# Patient Record
Sex: Female | Born: 1955 | Race: Black or African American | Hispanic: No | Marital: Single | Smoking: Former smoker
Health system: Southern US, Community
[De-identification: ages and names within clinical notes are randomized; demographics above are authoritative.]

## PROBLEM LIST (undated history)

## (undated) DIAGNOSIS — C801 Malignant (primary) neoplasm, unspecified: Secondary | ICD-10-CM

## (undated) DIAGNOSIS — I639 Cerebral infarction, unspecified: Secondary | ICD-10-CM

## (undated) DIAGNOSIS — I10 Essential (primary) hypertension: Secondary | ICD-10-CM

## (undated) DIAGNOSIS — Z8279 Family history of other congenital malformations, deformations and chromosomal abnormalities: Secondary | ICD-10-CM

## (undated) DIAGNOSIS — Q8500 Neurofibromatosis, unspecified: Secondary | ICD-10-CM

## (undated) DIAGNOSIS — Z8051 Family history of malignant neoplasm of kidney: Secondary | ICD-10-CM

## (undated) DIAGNOSIS — Z803 Family history of malignant neoplasm of breast: Secondary | ICD-10-CM

## (undated) HISTORY — DX: Neurofibromatosis, unspecified: Q85.00

## (undated) HISTORY — DX: Family history of other congenital malformations, deformations and chromosomal abnormalities: Z82.79

## (undated) HISTORY — PX: NO PAST SURGERIES: SHX2092

## (undated) HISTORY — DX: Family history of malignant neoplasm of breast: Z80.3

## (undated) HISTORY — DX: Family history of malignant neoplasm of kidney: Z80.51

---

## 2021-06-07 ENCOUNTER — Other Ambulatory Visit: Payer: Self-pay | Admitting: Nurse Practitioner

## 2021-06-07 DIAGNOSIS — I69354 Hemiplegia and hemiparesis following cerebral infarction affecting left non-dominant side: Secondary | ICD-10-CM

## 2021-06-07 DIAGNOSIS — R2689 Other abnormalities of gait and mobility: Secondary | ICD-10-CM

## 2021-06-09 ENCOUNTER — Encounter (HOSPITAL_COMMUNITY): Payer: Self-pay | Admitting: Emergency Medicine

## 2021-06-09 ENCOUNTER — Other Ambulatory Visit: Payer: Self-pay

## 2021-06-09 ENCOUNTER — Emergency Department (HOSPITAL_COMMUNITY): Payer: Medicare Other

## 2021-06-09 ENCOUNTER — Emergency Department (HOSPITAL_COMMUNITY)
Admission: EM | Admit: 2021-06-09 | Discharge: 2021-06-09 | Disposition: A | Discharge status: Home / Self Care | Payer: Medicare Other | Attending: Emergency Medicine | Admitting: Emergency Medicine

## 2021-06-09 DIAGNOSIS — Z8673 Personal history of transient ischemic attack (TIA), and cerebral infarction without residual deficits: Secondary | ICD-10-CM | POA: Diagnosis not present

## 2021-06-09 DIAGNOSIS — Z79899 Other long term (current) drug therapy: Secondary | ICD-10-CM | POA: Insufficient documentation

## 2021-06-09 DIAGNOSIS — I10 Essential (primary) hypertension: Secondary | ICD-10-CM | POA: Diagnosis not present

## 2021-06-09 DIAGNOSIS — Z7982 Long term (current) use of aspirin: Secondary | ICD-10-CM | POA: Diagnosis not present

## 2021-06-09 DIAGNOSIS — M542 Cervicalgia: Secondary | ICD-10-CM | POA: Diagnosis present

## 2021-06-09 DIAGNOSIS — C3490 Malignant neoplasm of unspecified part of unspecified bronchus or lung: Secondary | ICD-10-CM

## 2021-06-09 DIAGNOSIS — R531 Weakness: Secondary | ICD-10-CM | POA: Diagnosis not present

## 2021-06-09 DIAGNOSIS — R29898 Other symptoms and signs involving the musculoskeletal system: Secondary | ICD-10-CM

## 2021-06-09 HISTORY — DX: Essential (primary) hypertension: I10

## 2021-06-09 HISTORY — DX: Cerebral infarction, unspecified: I63.9

## 2021-06-09 LAB — CBC WITH DIFFERENTIAL/PLATELET
Abs Immature Granulocytes: 0.2 10*3/uL — ABNORMAL HIGH (ref 0.00–0.07)
Basophils Absolute: 0 10*3/uL (ref 0.0–0.1)
Basophils Relative: 0 %
Eosinophils Absolute: 0.1 10*3/uL (ref 0.0–0.5)
Eosinophils Relative: 1 %
HCT: 46.3 % — ABNORMAL HIGH (ref 36.0–46.0)
Hemoglobin: 14.4 g/dL (ref 12.0–15.0)
Immature Granulocytes: 1 %
Lymphocytes Relative: 8 %
Lymphs Abs: 1.5 10*3/uL (ref 0.7–4.0)
MCH: 29.7 pg (ref 26.0–34.0)
MCHC: 31.1 g/dL (ref 30.0–36.0)
MCV: 95.5 fL (ref 80.0–100.0)
Monocytes Absolute: 0.8 10*3/uL (ref 0.1–1.0)
Monocytes Relative: 4 %
Neutro Abs: 16.7 10*3/uL — ABNORMAL HIGH (ref 1.7–7.7)
Neutrophils Relative %: 86 %
Platelets: 511 10*3/uL — ABNORMAL HIGH (ref 150–400)
RBC: 4.85 MIL/uL (ref 3.87–5.11)
RDW: 15.5 % (ref 11.5–15.5)
WBC: 19.3 10*3/uL — ABNORMAL HIGH (ref 4.0–10.5)
nRBC: 0 % (ref 0.0–0.2)

## 2021-06-09 LAB — COMPREHENSIVE METABOLIC PANEL
ALT: 16 U/L (ref 0–44)
AST: 16 U/L (ref 15–41)
Albumin: 3.6 g/dL (ref 3.5–5.0)
Alkaline Phosphatase: 68 U/L (ref 38–126)
Anion gap: 11 (ref 5–15)
BUN: 35 mg/dL — ABNORMAL HIGH (ref 8–23)
CO2: 28 mmol/L (ref 22–32)
Calcium: 9.4 mg/dL (ref 8.9–10.3)
Chloride: 106 mmol/L (ref 98–111)
Creatinine, Ser: 0.61 mg/dL (ref 0.44–1.00)
GFR, Estimated: >60 mL/min (ref >60)
Glucose, Bld: 127 mg/dL — ABNORMAL HIGH (ref 70–99)
Potassium: 4.2 mmol/L (ref 3.5–5.1)
Sodium: 145 mmol/L (ref 135–145)
Total Bilirubin: 0.4 mg/dL (ref 0.3–1.2)
Total Protein: 7.4 g/dL (ref 6.5–8.1)

## 2021-06-09 MED ORDER — IOHEXOL 350 MG/ML SOLN
100.0000 mL | Freq: Once | INTRAVENOUS | Status: AC | PRN
  Administered 2021-06-09: 75 mL via INTRAVENOUS

## 2021-06-09 MED ORDER — PREDNISONE 20 MG PO TABS: 40.0000 mg | ORAL_TABLET | Freq: Every day | ORAL | 0 refills | Status: DC

## 2021-06-09 MED ORDER — OXYCODONE-ACETAMINOPHEN 5-325 MG PO TABS: 1.0000 | ORAL_TABLET | ORAL | 0 refills | Status: DC | PRN

## 2021-06-09 MED ORDER — SODIUM CHLORIDE (PF) 0.9 % IJ SOLN
INTRAMUSCULAR | Status: AC
  Filled 2021-06-09: qty 50

## 2021-06-09 NOTE — ED Provider Notes (Signed)
[facility] DEPT Provider Note   CSN: [id number] Arrival date & time: 06/09/21  2409     History Chief Complaint  Patient presents with   Neck Pain    Lynn Wiley is a 65 y.o. female.  65 year old female with past medical history including neurofibromatosis, hypertension, CVA with residual left-sided weakness who presents with right neck pain and right hand weakness.  Since May, she has had progressively worsening pain in her right neck going into her shoulder and down her arm.  Over the past week, she has had worsening weakness of her right hand.  She is able to lift and move her arm okay.  She has some mild left arm weakness due to her previous stroke but denies any other areas of new weakness and specifically no leg weakness.  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  She was referred here for further evaluation.  Of note, her documentation from the clinic visit notes that she just completed a prednisone taper.  Patient reports possible few pounds of weight loss but no dramatic weight loss.  She denies any abdominal pain, vomiting, diarrhea, bloody stools, headaches, vision changes, night sweats, chills, fevers, or urinary symptoms.  She denies any personal history of cancer.  The history is provided by the patient.  Neck Pain     Past Medical History:  Diagnosis Date   Hypertension    Stroke [facility])   Neurofibromatosis  There are no problems to display for this patient.   History reviewed. No pertinent surgical history.   OB History   No obstetric history on file.     No family history on file.     Home Medications Prior to Admission medications   Medication Sig Start Date End Date Taking? Authorizing Provider  amLODipine (NORVASC) 10 MG tablet Take 10 mg by mouth daily. 05/31/21  Yes [provider]  Ascorbic Acid (VITAMIN C PO)  Take 1 tablet by mouth daily.   Yes [provider]  ASPIRIN LOW DOSE 81 MG EC tablet Take 81 mg by mouth daily. 06/01/21  Yes [provider]  carvedilol (COREG) 12.5 MG tablet Take 12.5 mg by mouth 2 (two) times daily. 06/01/21  Yes [provider]  cyclobenzaprine (FLEXERIL) 5 MG tablet Take 5 mg by mouth 3 (three) times daily as needed for muscle spasms.   Yes [provider]  Menthol-Camphor (TIGER BALM ARTHRITIS RUB EX) Apply 1 application topically as needed (arm/hand pain).   Yes [provider]  Omega-3 Fatty Acids (FISH OIL PO) Take 1 capsule by mouth daily.   Yes [provider]  oxyCODONE-acetaminophen (PERCOCET) 5-325 MG tablet Take 1 tablet by mouth every 4 (four) hours as needed. 06/09/21  Yes Honest Safranek, Wenda Overland, MD  pravastatin (PRAVACHOL) 40 MG tablet Take 40 mg by mouth at bedtime. 06/01/21  Yes [provider]  predniSONE (DELTASONE) 20 MG tablet Take 2 tablets (40 mg total) by mouth daily. Take 40 mg by mouth daily for 3 days, then 20mg  by mouth daily for 3 days, then 10mg  daily for 3 days 06/09/21  Yes Edmund Holcomb, Wenda Overland, MD    Allergies    Patient has no known allergies.  Review of Systems   Review of Systems  Musculoskeletal:  Positive for neck pain.  All other systems reviewed and are negative except that which was mentioned in HPI  Physical Exam  Updated Vital Signs BP (!) 141/91   Pulse 85   Temp 97.6 F (36.4 C)   Resp 17   SpO2 99%   Physical Exam Constitutional:      General: She is not in acute distress.    Appearance: Normal appearance.  HENT:     Head: Normocephalic and atraumatic.  Eyes:     Conjunctiva/sclera: Conjunctivae normal.  Cardiovascular:     Rate and Rhythm: Normal rate and regular rhythm.     Heart sounds: Normal heart sounds. No murmur heard. Pulmonary:     Effort: Pulmonary effort is normal.     Breath sounds: Normal breath sounds.  Abdominal:     General: Abdomen  is flat. Bowel sounds are normal. There is no distension.     Palpations: Abdomen is soft.     Tenderness: There is no abdominal tenderness.  Musculoskeletal:        General: Normal range of motion.     Cervical back: Neck supple.     Right lower leg: No edema.     Left lower leg: No edema.  Skin:    General: Skin is warm and dry.     Comments: Diffuse skin tags throughout body including neck and trunk  Neurological:     Mental Status: She is alert and oriented to person, place, and time.     Deep Tendon Reflexes:     Reflex Scores:      Brachioradialis reflexes are 2+ on the right side and 3+ on the left side.      Patellar reflexes are 2+ on the right side and 2+ on the left side.    Comments: Fluent speech, CN II-XII intact, L pronator drift, 5/5 biceps/triceps strength BUE; 2/5 grip strength R hand; 5/5 strength BLE  Psychiatric:        Mood and Affect: Mood normal.        Behavior: Behavior normal.    ED Results / Procedures / Treatments   Labs (all labs ordered are listed, but only abnormal results are displayed) Labs Reviewed  COMPREHENSIVE METABOLIC PANEL - Abnormal; Notable for the following components:      Result Value   Glucose, Bld 127 (*)    BUN 35 (*)    All other components within normal limits  CBC WITH DIFFERENTIAL/PLATELET - Abnormal; Notable for the following components:   WBC 19.3 (*)    HCT 46.3 (*)    Platelets 511 (*)    Neutro Abs 16.7 (*)    Abs Immature Granulocytes 0.20 (*)    All other components within normal limits  PROTEIN ELECTROPHORESIS, SERUM  KAPPA/LAMBDA LIGHT CHAINS    EKG None  Radiology CT CHEST ABDOMEN PELVIS W CONTRAST  Result Date: 06/09/2021 CLINICAL DATA:  Lung lesion seen on thoracic spine films. Bone lesions noted in the cervical spine MRI from 06/07/2021. EXAM: CT CHEST, ABDOMEN, AND PELVIS WITH CONTRAST TECHNIQUE: Multidetector CT imaging of the chest, abdomen and pelvis was performed following the standard protocol  during bolus administration of intravenous contrast. CONTRAST:  62mL OMNIPAQUE IOHEXOL 350 MG/ML SOLN COMPARISON:  Thoracic and cervical spine films 06/02/2021 and cervical spine MRI 06/07/2021 FINDINGS: CT CHEST FINDINGS Cardiovascular: The heart is normal in size. No pericardial effusion. There is mild tortuosity, ectasia and calcification of the thoracic aorta but no dissection. Minimal scattered coronary artery calcifications. Mediastinum/Nodes: No mediastinal or hilar mass or lymphadenopathy. The esophagus is grossly normal. Lungs/Pleura: Biapical pleural and parenchymal scarring changes are noted. There  is a large lobulated mass in the left upper lobe measuring approximately 3.5 x 3.0 cm and worrisome primary lung neoplasm. There are 5 small right upper lobe pulmonary nodules all measuring less than 4 mm certainly suspicious for pulmonary metastatic nodules. Patchy areas of atelectasis and subpleural scarring changes at the left lung base. Musculoskeletal: Destructive lytic lesion involving the T1 vertebral body and transverse process and possibly part of the first right rib. This was also demonstrated on the recent MRI. I do not identify any other definite metastatic lesions involving the thoracic spine sternum or ribs. CT ABDOMEN PELVIS FINDINGS Hepatobiliary: Small low-attenuation lesion segment 8 is likely a benign cyst. No other hepatic lesions to suggest metastatic disease. Gallbladder is unremarkable. No common bile duct dilatation. Pancreas: No mass, inflammation or ductal dilatation. Mild fatty atrophy. Spleen: Normal size.  No focal lesions. Adrenals/Urinary Tract: 2.0 x 2.0 cm left adrenal gland lesion consistent with a metastatic focus. No right-sided renal gland lesion. Right renal cysts are noted. No worrisome renal lesions. The bladder is unremarkable. Stomach/Bowel: The stomach, duodenum, small and colon are unremarkable. No acute inflammatory changes, mass lesions obstructive findings.  Terminal ileum is normal. The appendix is normal. Vascular/Lymphatic: Age advanced atherosclerotic calcifications involving the aorta and iliac arteries but no aneurysm or dissection. Branch vessels are patent. The major venous structures are patent. No mesenteric or retroperitoneal mass or adenopathy. Reproductive: The uterus and ovaries are unremarkable. There is a rounded lesion in the left myometrium which is likely a benign fibroid. Other: No free abdominal/free pelvic fluid collections. There is a small periumbilical abdominal wall hernia containing fat. Musculoskeletal: No definite destructive bony changes involving the lumbar spine pelvis. IMPRESSION: 1. 3.5 x 3.0 cm lobulated left upper lobe mass worrisome for primary lung neoplasm. No mediastinal or hilar mass or adenopathy. Recommend PET-CT and referral to multidisciplinary thoracic Oncology clinic. 2. Small right upper lobe pulmonary nodules certainly suspicious for pulmonary metastatic nodules. 3. Destructive lytic lesion involving the right T1 vertebral body and transverse process and possibly part of the first right rib. Other thoracic spine metastatic disease noted on recent cervical spine MRI. No other definite metastatic lesions involving the thoracic or lumbar spine. 4. 2.0 x 2.0 cm left adrenal gland lesion consistent with metastatic disease. 5. No evidence of hepatic metastatic disease. 6. Age advanced atherosclerotic calcifications involving the thoracic and abdominal aorta and iliac arteries. Aortic Atherosclerosis (ICD10-I70.0). Electronically Signed   By: Marijo Sanes M.D.   On: 06/09/2021 13:47    Procedures Procedures   Medications Ordered in ED Medications  sodium chloride (PF) 0.9 % injection (  Given by Other 06/09/21 1306)  iohexol (OMNIPAQUE) 350 MG/ML injection 100 mL (75 mLs Intravenous Contrast Given 06/09/21 1233)    ED Course  I have reviewed the triage vital signs and the nursing notes.  Pertinent labs & imaging  results that were available during my care of the patient were reviewed by me and considered in my medical decision making (see chart for details).    MDM Rules/Calculators/A&P                           Right grip strength notable on exam and left pronator drift sounds chronic from previous stroke.  I reviewed notes and MRI from neurosurgical spine clinic.  Discussed with oncology, Dr. Alen Blew, who recommended MM labs and CT chest through pelvis.   CT shows left upper lobe mass suggestive of primary lung  neoplasm with metastatic nodules, lytic lesion at T1, and left adrenal lesion.  Patient has no other complaints of pain currently.  I discussed her neck symptoms with her neurosurgeon, Dr. Vertell Limber, who had seen her in the clinic this morning.  He recommended continuing Aspen collar and restarting prednisone taper.  Dr. Alen Blew has been messaged to assist w/ oncology clinic f/u for coordination of work up and treatment. Provided w/ prednisone and percocet for pain.  I have extensively reviewed return precautions with the patient and her daughter including any worsening neurologic symptoms or new neurologic symptoms.  They voiced understanding of oncology follow-up plan. Final Clinical Impression(s) / ED Diagnoses Final diagnoses:  Neck pain on right side  Primary malignant neoplasm of lung metastatic to other site, unspecified laterality ([place])  Right hand weakness    Rx / DC Orders ED Discharge Orders          Ordered    oxyCODONE-acetaminophen (PERCOCET) 5-325 MG tablet  Every 4 hours PRN        06/09/21 1537    predniSONE (DELTASONE) 20 MG tablet  Daily        06/09/21 1537             Emmaly Leech, Wenda Overland, MD 06/09/21 1615

## 2021-06-09 NOTE — Discharge Instructions (Addendum)
You can take colace 1 capsule once or twice daily. You can also take miralax 1 capful mixed in drink one to three times daily for constipation.   DO NOT TAKE TYLENOL IF YOU ARE TAKING PERCOCET FOR PAIN.

## 2021-06-09 NOTE — ED Triage Notes (Addendum)
Patient BIB daughter, sent by [place] [facility] after seen today with complaints of weakness in right hand x2 weeks and shoulder and neck pain. MRI completed on 7/18. Arrives with aspen collar applied at specialist PTA.

## 2021-06-10 ENCOUNTER — Encounter: Payer: Self-pay | Admitting: *Deleted

## 2021-06-10 DIAGNOSIS — C7951 Secondary malignant neoplasm of bone: Secondary | ICD-10-CM

## 2021-06-10 LAB — PROTEIN ELECTROPHORESIS, SERUM
A/G Ratio: 1 (ref 0.7–1.7)
Albumin ELP: 3.3 g/dL (ref 2.9–4.4)
Alpha-1-Globulin: 0.2 g/dL (ref 0.0–0.4)
Alpha-2-Globulin: 0.8 g/dL (ref 0.4–1.0)
Beta Globulin: 1.2 g/dL (ref 0.7–1.3)
Gamma Globulin: 1 g/dL (ref 0.4–1.8)
Globulin, Total: 3.2 g/dL (ref 2.2–3.9)
Total Protein ELP: 6.5 g/dL (ref 6.0–8.5)

## 2021-06-10 LAB — KAPPA/LAMBDA LIGHT CHAINS
Kappa free light chain: 21.4 mg/L — ABNORMAL HIGH (ref 3.3–19.4)
Kappa, lambda light chain ratio: 1.03 (ref 0.26–1.65)
Lambda free light chains: 20.8 mg/L (ref 5.7–26.3)

## 2021-06-10 NOTE — Progress Notes (Signed)
I received referral on Lynn Wiley today. I called and scheduled her to be seen next week with Dr. Julien Nordmann.  She verbalized understanding.

## 2021-06-11 ENCOUNTER — Ambulatory Visit
Admission: RE | Admit: 2021-06-11 | Discharge: 2021-06-11 | Disposition: A | Discharge status: Home / Self Care | Payer: Self-pay | Source: Ambulatory Visit | Attending: Radiation Oncology | Admitting: Radiation Oncology

## 2021-06-11 ENCOUNTER — Telehealth: Payer: Self-pay | Admitting: Radiation Oncology

## 2021-06-11 ENCOUNTER — Other Ambulatory Visit: Payer: Self-pay | Admitting: Radiation Oncology

## 2021-06-11 ENCOUNTER — Other Ambulatory Visit: Payer: Self-pay | Admitting: Radiation Therapy

## 2021-06-11 DIAGNOSIS — C7951 Secondary malignant neoplasm of bone: Secondary | ICD-10-CM

## 2021-06-14 NOTE — Progress Notes (Addendum)
Histology and Location of Primary Cancer:  Primary malignant neoplasm of lung metastatic to other site  Sites of Visceral and Bony Metastatic Disease: CT C/A/P w/ Contrast 06/09/2021 --IMPRESSION: 1. 3.5 x 3.0 cm lobulated left upper lobe mass worrisome for primary lung neoplasm. No mediastinal or hilar mass or adenopathy. Recommend PET-CT and referral to multidisciplinary thoracic Oncology clinic. 2. Small right upper lobe pulmonary nodules certainly suspicious for pulmonary metastatic nodules. 3. Destructive lytic lesion involving the right T1 vertebral body and transverse process and possibly part of the first right rib. Other thoracic spine metastatic disease noted on recent cervical spine MRI. No other definite metastatic lesions involving the thoracic or lumbar spine. 4. 2.0 x 2.0 cm left adrenal gland lesion consistent with metastatic disease. 5. No evidence of hepatic metastatic disease. 6. Age advanced atherosclerotic calcifications involving the thoracic and abdominal aorta and iliac arteries.  MRI Cervical Spine w/o Contrast 06/07/2021 --IMPRESSION: 1. Marrow replacing bone lesions within the C6, C7, and T1 vertebrae with involvement of the right pedicles and posterior elements. Associated bulky extraosseous soft tissue masses occupying the right-sided foramina at the C5-6, C6-7, C7 T1, and T1-T2 levels. Findings are most consistent with metastatic disease or possibly myeloma. 2. Epidural tumor involvement on the right extending from the C7-T1 to T1-T2 levels. There is mild mass effect on the thecal sac at the T1 level without significant canal stenosis or cord signal changes. 3. Right vertebral artery is completely encased by tumor at the C6 level. 4. Incompletely evaluated 2.2 cm somewhat nodular density in the periphery of the left upper lobe seen on localizer sequences. This finding could potentially represent primary malignancy. Contrast enhanced CT of the chest is recommended  for further evaluation.  Past/Anticipated chemotherapy by medical oncology, if any:  Scheduled for consultation with Dr. Curt Bears later today   Past/Anticipated interventions by cardiothoracic surgery, if any:  No referral placed at this time  Tobacco/Marijuana/Snuff/ETOH use: Former smoker (quit in 2017 after stroke).   Signs/Symptoms Weight changes, if any: Reports a decrease in appetite that has led to ~10 lb unintentional weight loss Respiratory complaints, if any: Reports a chronic dry cough that occurs randomly throughout the day Hemoptysis, if any: Patient denies Pain issues, if any:  Reports on-going pain to her right shoulder. Pain radiates down the medial aspect of her right arm to her 4th and 5th finger. Pain is constant and diminshes slightly with prescription pain medication (has to take pain medication around the clock for relief)  If Spine Met(s), symptoms, if any, include: Bowel/Bladder retention or incontinence (please describe): Reports occasional urinary leakage if she is unable to make it to the bathroom in time, as well as occasional stress incontinence. Daughter reports BM every 3 days (was an issue prior to prescription pain medication due to decrease in eating/drinking). Managing constipation with daily stool softners  Numbness or weakness in extremities (please describe): Reports numbness/tingling/burning sensation from her 4th and 5th fingers on her right hand, down to her right elbow. Unable to fully close her right hand to use for ADLs (patient is right hand dominate) Current Decadron regimen, if applicable: Not applicable; Patient is on a prednisone taper (has 2 days left of prescription)  Ambulatory status? Walker? Wheelchair?: Ambulatory but gait is unsteady. Often requires stand-by assist for balance issues  SAFETY ISSUES: Prior radiation? No Pacemaker/ICD? No Possible current pregnancy? No--postmenopausal Is the patient on methotrexate?  No  Current Complaints / other details:  Scheduled for Head CT w/o contrast  on 06/19/2021

## 2021-06-14 NOTE — Progress Notes (Signed)
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 Primary malignant neoplasm of lung metastatic to other site, unspecified laterality [facility])  C34.90 Ambulatory referral to Social Work    2. Bone metastases (HCC)  C79.51 dexamethasone (DECADRON) 4 MG tablet    omeprazole (PRILOSEC) 20 MG capsule     Stage IV putative metastatic lung cancer  CHIEF COMPLAINT: Here to discuss management of lung cancer with unspecified metastasis other site.  HISTORY OF PRESENT ILLNESS::Lynn Wiley is a 65 y.o. female who presented with right neck pain and right hand weakness to the [facility] ED on 06/09/21. The patient stated that since this past May, the right sided neck pain has been progressively worsening; with the pain going into her shoulder and down her arm. In the week prior to her ED visit, she stated that her right hand weakness became worse. She stated that she is able to lift and move her arm okay despite having mild left arm weakness due to history of CVA. MRI taken during this visit (she was seen at the [facility] clinic that day) revealed a likely metastatic lesion if the cervical spine. The patient denied any abdominal pain, vomiting, diarrhea, bloody stools, headaches, vision changes, night sweats, chills, fever, or urinary symptoms. (Patient was discharged the same day).   Furthermore, CT of the abdomen and pelvis with contrast taken on 06/09/21 demonstrated: a 3.5 x 3.0 cm lobulated left upper lobe mass concerning for primary lung neoplasm, as well as some small right upper lobe nodules suspicious for pulmonary metastatic nodules. Also  seen was a destructive lytic lesion involving the right T1 vertebral body and transverse process and possibly part of the first right rib, as well as a  2.0 x 2.0 cm left adrenal gland lesion; consistent with metastatic disease. Of note: other thoracic spine metastatic disease noted on recent cervical spine MRI taken during ED visit (results pending).   Of note: the patient reports a few pounds of weight loss .   Tobacco/Marijuana/Snuff/ETOH use: Former smoker (quit in 2017 after stroke).   Signs/Symptoms Weight changes, if any: Reports a decrease in appetite that has led to ~10 lb unintentional weight loss Respiratory complaints, if any: Reports a chronic dry cough that occurs randomly throughout the day Hemoptysis, if any: Patient denies Pain issues, if any:  Reports on-going pain to her right shoulder. Pain radiates down the medial aspect of her right arm to her 4th and 5th finger. Pain is constant and diminshes slightly with prescription pain medication (has to take pain medication around the clock for relief)  If Spine Met(s), symptoms, if any, include: Bowel/Bladder retention or incontinence (please describe): Reports occasional urinary leakage if she is unable to make it to the bathroom in time, as well as occasional stress incontinence. Daughter reports BM every 3 days (was an issue prior to prescription pain medication due to decrease in eating/drinking). Managing constipation with daily stool softners  Numbness or weakness in extremities (please describe): Reports numbness/tingling/burning sensation from her 4th and 5th fingers on her right hand, down to her right elbow. Unable to fully close her right hand to use for ADLs (patient  is right hand dominate) Current Decadron regimen, if applicable: Not applicable; Patient is on a prednisone taper (has 2 days left of prescription) - symptoms improved on $RemoveBef'40mg'UBLtfYzdnc$ /day and worsened upon tapering to lower dose  Ambulatory status? Walker? Wheelchair?:  Ambulatory but gait is unsteady. Often requires stand-by assist for balance issues  SAFETY ISSUES: Prior radiation? No Pacemaker/ICD? No Possible current pregnancy? No--postmenopausal Is the patient on methotrexate? No  Current Complaints / other details:  Scheduled for Head CT w/o contrast on 06/19/2021  She plans to move to [place] from [place] to be near family.  Tissue biopsy pending. Med/onc consult pending.   PREVIOUS RADIATION THERAPY: No  PAST MEDICAL HISTORY:  has a past medical history of Family history of breast cancer, Family history of kidney cancer, Family history of neurofibromatosis, Hypertension, Neurofibromatosis ([place]), and Stroke ([place]). And neurofibromatosis  PAST SURGICAL HISTORY: Past Surgical History:  Procedure Laterality Date   NO PAST SURGERIES      FAMILY HISTORY: family history includes Breast cancer in her paternal aunt and paternal aunt; Breast cancer (age of onset: 73) in her sister; Cancer in her cousin and paternal uncle; Gastric cancer in her brother; Heart attack in her brother; Kidney cancer in her brother; Lung cancer in her mother; Neurofibromatosis in her maternal aunt, maternal grandmother, maternal uncle, mother, and sister; Throat cancer in her father.  SOCIAL HISTORY:  reports that she quit smoking about 5 years ago. Her smoking use included cigarettes. She smoked an average of .5 packs per day. She has never used smokeless tobacco.  ALLERGIES: Patient has no known allergies.  MEDICATIONS: (Completed prednisone taper on day of ED visit ;06/09/21) Current Outpatient Medications  Medication Sig Dispense Refill   dexamethasone (DECADRON) 4 MG tablet Take 1 tablet (4 mg total) by mouth 3 (three) times daily. Take with food. 90 tablet 0   omeprazole (PRILOSEC) 20 MG capsule Take 1 capsule (20 mg total) by mouth daily. Take while on Dexamethasone to protect stomach. 30 capsule 2   amLODipine (NORVASC) 10 MG tablet Take 10 mg by mouth 2 (two) times  daily.     Ascorbic Acid (VITAMIN C PO) Take 1 tablet by mouth daily.     ASPIRIN LOW DOSE 81 MG EC tablet Take 81 mg by mouth daily.     carvedilol (COREG) 12.5 MG tablet Take 12.5 mg by mouth 2 (two) times daily.     cyclobenzaprine (FLEXERIL) 5 MG tablet Take 5 mg by mouth 3 (three) times daily as needed for muscle spasms.     LORazepam (ATIVAN) 0.5 MG tablet 1 tablet p.o. 30 minutes before the MRI.  Repeat once if needed 2 tablet 0   Menthol-Camphor (TIGER BALM ARTHRITIS RUB EX) Apply 1 application topically as needed (arm/hand pain).     Omega-3 Fatty Acids (FISH OIL PO) Take 1 capsule by mouth daily.     oxyCODONE-acetaminophen (PERCOCET) 5-325 MG tablet Take 1 tablet by mouth every 4 (four) hours as needed. 20 tablet 0   pravastatin (PRAVACHOL) 40 MG tablet Take 40 mg by mouth at bedtime.     No current facility-administered medications for this encounter.    REVIEW OF SYSTEMS:  Notable for that above.   PHYSICAL EXAM:  weight is 116 lb 6 oz (52.8 kg). Her blood pressure is 138/83 and her pulse is 84. Her respiration is 17 and oxygen saturation is 94%.   General: Alert and oriented, in no acute distress   HEENT: Head is normocephalic. Extraocular movements are  intact. Oropharynx is clear. Heart: Regular in rate and rhythm with no murmurs, rubs, or gallops. Chest: Clear to auscultation bilaterally, with no rhonchi, wheezes, or rales. Abdomen: Soft, nontender, nondistended, with no rigidity or guarding. Extremities: No cyanosis or edema. Skin: multiple neurofibromas Musculoskeletal: Right arm weakness, right hand weakness, decreased sensation along medial right arm and 4th, 5th digits Neurologic:  Speech is fluent. Coordination is intact. Psychiatric: Judgment and insight are intact. Affect is appropriate.   ECOG = 1  0 - Asymptomatic (Fully active, able to carry on all predisease activities without restriction)  1 - Symptomatic but completely ambulatory (Restricted in  physically strenuous activity but ambulatory and able to carry out work of a light or sedentary nature. For example, light housework, office work)  2 - Symptomatic, <50% in bed during the day (Ambulatory and capable of all self care but unable to carry out any work activities. Up and about more than 50% of waking hours)  3 - Symptomatic, >50% in bed, but not bedbound (Capable of only limited self-care, confined to bed or chair 50% or more of waking hours)  4 - Bedbound (Completely disabled. Cannot carry on any self-care. Totally confined to bed or chair)  5 - Death   Eustace Pen MM, Creech RH, Tormey DC, et al. [phone]). "Toxicity and response criteria of the [facility]". [place] Oncol. 5 (6): 649-55   LABORATORY DATA:  Lab Results  Component Value Date   WBC 21.1 (H) 06/15/2021   HGB 13.2 06/15/2021   HCT 41.6 06/15/2021   MCV 93.5 06/15/2021   PLT 510 (H) 06/15/2021   CMP     Component Value Date/Time   NA 143 06/15/2021 1044   K 3.6 06/15/2021 1044   CL 108 06/15/2021 1044   CO2 24 06/15/2021 1044   GLUCOSE 154 (H) 06/15/2021 1044   BUN 22 06/15/2021 1044   CREATININE 0.83 06/15/2021 1044   CALCIUM 8.8 (L) 06/15/2021 1044   PROT 6.7 06/15/2021 1044   ALBUMIN 2.9 (L) 06/15/2021 1044   AST 15 06/15/2021 1044   ALT 12 06/15/2021 1044   ALKPHOS 72 06/15/2021 1044   BILITOT 0.3 06/15/2021 1044   GFRNONAA >60 06/15/2021 1044         RADIOGRAPHY: CT CHEST ABDOMEN PELVIS W CONTRAST  Result Date: 06/09/2021 CLINICAL DATA:  Lung lesion seen on thoracic spine films. Bone lesions noted in the cervical spine MRI from 06/07/2021. EXAM: CT CHEST, ABDOMEN, AND PELVIS WITH CONTRAST TECHNIQUE: Multidetector CT imaging of the chest, abdomen and pelvis was performed following the standard protocol during bolus administration of intravenous contrast. CONTRAST:  32mL OMNIPAQUE IOHEXOL 350 MG/ML SOLN COMPARISON:  Thoracic and cervical spine films 06/02/2021 and cervical  spine MRI 06/07/2021 FINDINGS: CT CHEST FINDINGS Cardiovascular: The heart is normal in size. No pericardial effusion. There is mild tortuosity, ectasia and calcification of the thoracic aorta but no dissection. Minimal scattered coronary artery calcifications. Mediastinum/Nodes: No mediastinal or hilar mass or lymphadenopathy. The esophagus is grossly normal. Lungs/Pleura: Biapical pleural and parenchymal scarring changes are noted. There is a large lobulated mass in the left upper lobe measuring approximately 3.5 x 3.0 cm and worrisome primary lung neoplasm. There are 5 small right upper lobe pulmonary nodules all measuring less than 4 mm certainly suspicious for pulmonary metastatic nodules. Patchy areas of atelectasis and subpleural scarring changes at the left lung base. Musculoskeletal: Destructive lytic lesion involving the T1 vertebral body and transverse process and possibly part of the  first right rib. This was also demonstrated on the recent MRI. I do not identify any other definite metastatic lesions involving the thoracic spine sternum or ribs. CT ABDOMEN PELVIS FINDINGS Hepatobiliary: Small low-attenuation lesion segment 8 is likely a benign cyst. No other hepatic lesions to suggest metastatic disease. Gallbladder is unremarkable. No common bile duct dilatation. Pancreas: No mass, inflammation or ductal dilatation. Mild fatty atrophy. Spleen: Normal size.  No focal lesions. Adrenals/Urinary Tract: 2.0 x 2.0 cm left adrenal gland lesion consistent with a metastatic focus. No right-sided renal gland lesion. Right renal cysts are noted. No worrisome renal lesions. The bladder is unremarkable. Stomach/Bowel: The stomach, duodenum, small and colon are unremarkable. No acute inflammatory changes, mass lesions obstructive findings. Terminal ileum is normal. The appendix is normal. Vascular/Lymphatic: Age advanced atherosclerotic calcifications involving the aorta and iliac arteries but no aneurysm or  dissection. Branch vessels are patent. The major venous structures are patent. No mesenteric or retroperitoneal mass or adenopathy. Reproductive: The uterus and ovaries are unremarkable. There is a rounded lesion in the left myometrium which is likely a benign fibroid. Other: No free abdominal/free pelvic fluid collections. There is a small periumbilical abdominal wall hernia containing fat. Musculoskeletal: No definite destructive bony changes involving the lumbar spine pelvis. IMPRESSION: 1. 3.5 x 3.0 cm lobulated left upper lobe mass worrisome for primary lung neoplasm. No mediastinal or hilar mass or adenopathy. Recommend PET-CT and referral to multidisciplinary thoracic Oncology clinic. 2. Small right upper lobe pulmonary nodules certainly suspicious for pulmonary metastatic nodules. 3. Destructive lytic lesion involving the right T1 vertebral body and transverse process and possibly part of the first right rib. Other thoracic spine metastatic disease noted on recent cervical spine MRI. No other definite metastatic lesions involving the thoracic or lumbar spine. 4. 2.0 x 2.0 cm left adrenal gland lesion consistent with metastatic disease. 5. No evidence of hepatic metastatic disease. 6. Age advanced atherosclerotic calcifications involving the thoracic and abdominal aorta and iliac arteries. Aortic Atherosclerosis (ICD10-I70.0). Electronically Signed   By: Marijo Sanes M.D.   On: 06/09/2021 13:47      IMPRESSION/PLAN: Today, I talked to the patient about the findings and work-up thus far.  We discussed the patient's diagnosis of metastatic cancer to bone, likely from lung, and general treatment for this, highlighting the role of radiotherapy in the management.  We discussed the available radiation techniques, and focused on the details of logistics and delivery.     We discussed the risks, benefits, and side effects of  2 weeks of palliative spine radiotherapy. Side effects may include but not necessarily  be limited to: skin irritation, fatigue, mucositis, rare injury to internal organs/nerves/ and or spinal cord.  No guarantees of treatment were given. A consent form was signed and placed in the patient's medical record. The patient was encouraged to ask questions that I answered to the best of my ability.    I personally reviewed her images and am highly confident this is cancer, but diagnosis has not been established by tissue biopsy.  We discussed pros and cons of starting treatment ASAP vs starting tx after biopsy results are back.  Given the patient's and daughters' desire to prove diagnosis first and complete her move from [place] next week, we will arrange CT simulation for August 8 and start RT soon thereafter.  I told her to stop her Prednisone and Rx'd a high dose dexamethasone regimen to preserve function and help her pain/symptoms. Omeprazole for GI prophylaxis.  Referral to  genetics due to clinical dx of neurofibromatosis.  Pt, family, pleased with the plan above.   On date of service, in total, I spent 60 minutes on this encounter. Patient was seen in person.   __________________________________________   Eppie Gibson, MD  This document serves as a record of services personally performed by Eppie Gibson, MD. It was created on her behalf by Roney Mans, a trained medical scribe. The creation of this record is based on the scribe's personal observations and the provider's statements to them. This document has been checked and approved by the attending provider.

## 2021-06-15 ENCOUNTER — Encounter: Payer: Self-pay | Admitting: *Deleted

## 2021-06-15 ENCOUNTER — Telehealth: Payer: Self-pay | Admitting: Radiation Oncology

## 2021-06-15 ENCOUNTER — Inpatient Hospital Stay: Payer: Medicare Other

## 2021-06-15 ENCOUNTER — Encounter: Payer: Self-pay | Admitting: Internal Medicine

## 2021-06-15 ENCOUNTER — Other Ambulatory Visit: Payer: Self-pay

## 2021-06-15 ENCOUNTER — Ambulatory Visit
Admission: RE | Admit: 2021-06-15 | Discharge: 2021-06-15 | Disposition: A | Discharge status: Home / Self Care | Payer: Medicare Other | Source: Ambulatory Visit | Attending: Radiation Oncology | Admitting: Radiation Oncology

## 2021-06-15 ENCOUNTER — Inpatient Hospital Stay: Payer: Medicare Other | Attending: Internal Medicine | Admitting: Internal Medicine

## 2021-06-15 VITALS — BP 134/82 | HR 72 | Temp 97.3°F | Resp 19 | Wt 118.3 lb

## 2021-06-15 VITALS — BP 138/83 | HR 84 | Resp 17 | Wt 116.4 lb

## 2021-06-15 DIAGNOSIS — C801 Malignant (primary) neoplasm, unspecified: Secondary | ICD-10-CM | POA: Diagnosis not present

## 2021-06-15 DIAGNOSIS — Z87891 Personal history of nicotine dependence: Secondary | ICD-10-CM

## 2021-06-15 DIAGNOSIS — C3490 Malignant neoplasm of unspecified part of unspecified bronchus or lung: Secondary | ICD-10-CM

## 2021-06-15 DIAGNOSIS — C7951 Secondary malignant neoplasm of bone: Secondary | ICD-10-CM | POA: Insufficient documentation

## 2021-06-15 DIAGNOSIS — Z8052 Family history of malignant neoplasm of bladder: Secondary | ICD-10-CM | POA: Insufficient documentation

## 2021-06-15 DIAGNOSIS — R918 Other nonspecific abnormal finding of lung field: Secondary | ICD-10-CM | POA: Diagnosis present

## 2021-06-15 DIAGNOSIS — K439 Ventral hernia without obstruction or gangrene: Secondary | ICD-10-CM | POA: Diagnosis not present

## 2021-06-15 DIAGNOSIS — I10 Essential (primary) hypertension: Secondary | ICD-10-CM | POA: Diagnosis not present

## 2021-06-15 DIAGNOSIS — Z7952 Long term (current) use of systemic steroids: Secondary | ICD-10-CM | POA: Diagnosis not present

## 2021-06-15 DIAGNOSIS — Z8673 Personal history of transient ischemic attack (TIA), and cerebral infarction without residual deficits: Secondary | ICD-10-CM

## 2021-06-15 DIAGNOSIS — E279 Disorder of adrenal gland, unspecified: Secondary | ICD-10-CM | POA: Diagnosis not present

## 2021-06-15 DIAGNOSIS — R531 Weakness: Secondary | ICD-10-CM

## 2021-06-15 DIAGNOSIS — Z803 Family history of malignant neoplasm of breast: Secondary | ICD-10-CM | POA: Insufficient documentation

## 2021-06-15 DIAGNOSIS — Z8279 Family history of other congenital malformations, deformations and chromosomal abnormalities: Secondary | ICD-10-CM

## 2021-06-15 DIAGNOSIS — Z79899 Other long term (current) drug therapy: Secondary | ICD-10-CM | POA: Diagnosis not present

## 2021-06-15 DIAGNOSIS — Q8500 Neurofibromatosis, unspecified: Secondary | ICD-10-CM | POA: Diagnosis not present

## 2021-06-15 DIAGNOSIS — N281 Cyst of kidney, acquired: Secondary | ICD-10-CM | POA: Diagnosis not present

## 2021-06-15 DIAGNOSIS — R634 Abnormal weight loss: Secondary | ICD-10-CM | POA: Diagnosis not present

## 2021-06-15 LAB — CBC WITH DIFFERENTIAL (CANCER CENTER ONLY)
Abs Immature Granulocytes: 0.2 10*3/uL — ABNORMAL HIGH (ref 0.00–0.07)
Basophils Absolute: 0 10*3/uL (ref 0.0–0.1)
Basophils Relative: 0 %
Eosinophils Absolute: 0.1 10*3/uL (ref 0.0–0.5)
Eosinophils Relative: 0 %
HCT: 41.6 % (ref 36.0–46.0)
Hemoglobin: 13.2 g/dL (ref 12.0–15.0)
Immature Granulocytes: 1 %
Lymphocytes Relative: 5 %
Lymphs Abs: 1.1 10*3/uL (ref 0.7–4.0)
MCH: 29.7 pg (ref 26.0–34.0)
MCHC: 31.7 g/dL (ref 30.0–36.0)
MCV: 93.5 fL (ref 80.0–100.0)
Monocytes Absolute: 0.6 10*3/uL (ref 0.1–1.0)
Monocytes Relative: 3 %
Neutro Abs: 19.2 10*3/uL — ABNORMAL HIGH (ref 1.7–7.7)
Neutrophils Relative %: 91 %
Platelet Count: 510 10*3/uL — ABNORMAL HIGH (ref 150–400)
RBC: 4.45 MIL/uL (ref 3.87–5.11)
RDW: 15 % (ref 11.5–15.5)
WBC Count: 21.1 10*3/uL — ABNORMAL HIGH (ref 4.0–10.5)
nRBC: 0 % (ref 0.0–0.2)

## 2021-06-15 LAB — CMP (CANCER CENTER ONLY)
ALT: 12 U/L (ref 0–44)
AST: 15 U/L (ref 15–41)
Albumin: 2.9 g/dL — ABNORMAL LOW (ref 3.5–5.0)
Alkaline Phosphatase: 72 U/L (ref 38–126)
Anion gap: 11 (ref 5–15)
BUN: 22 mg/dL (ref 8–23)
CO2: 24 mmol/L (ref 22–32)
Calcium: 8.8 mg/dL — ABNORMAL LOW (ref 8.9–10.3)
Chloride: 108 mmol/L (ref 98–111)
Creatinine: 0.83 mg/dL (ref 0.44–1.00)
GFR, Estimated: >60 mL/min (ref >60)
Glucose, Bld: 154 mg/dL — ABNORMAL HIGH (ref 70–99)
Potassium: 3.6 mmol/L (ref 3.5–5.1)
Sodium: 143 mmol/L (ref 135–145)
Total Bilirubin: 0.3 mg/dL (ref 0.3–1.2)
Total Protein: 6.7 g/dL (ref 6.5–8.1)

## 2021-06-15 MED ORDER — OMEPRAZOLE 20 MG PO CPDR: 20.0000 mg | DELAYED_RELEASE_CAPSULE | Freq: Every day | ORAL | 2 refills | Status: DC

## 2021-06-15 MED ORDER — LORAZEPAM 0.5 MG PO TABS: ORAL_TABLET | ORAL | 0 refills | Status: DC

## 2021-06-15 MED ORDER — DEXAMETHASONE 4 MG PO TABS: 4.0000 mg | ORAL_TABLET | Freq: Three times a day (TID) | ORAL | 0 refills | Status: DC

## 2021-06-15 NOTE — Telephone Encounter (Signed)
Scheduled appt per 7/26 sch msg. Called pt, no answer. Left msg with appt date and time.

## 2021-06-15 NOTE — Progress Notes (Signed)
[facility] [phone]   [phone]  CONSULT NOTE  REFERRING PHYSICIAN: Dr. Ambrose Finland Little  REASON FOR CONSULTATION:  65 years old white female with highly suspicious lung cancer.  HPI Lynn Wiley is a 65 y.o. female with past medical history significant for hypertension, stroke and neurofibromatosis as well as dyslipidemia and long history of smoking but quit in 2017.  The patient presented to the emergency department on 06/09/2021 complaining of residual left-sided weakness as well as right neck pain and right hand weakness since May 2022 that has been progressively worse with more pain in the right neck area and to her shoulder and down her arm.  During her evaluation she underwent CT scan of the chest, abdomen pelvis that showed large lobulated mass in the left upper lobe measuring approximately 3.5 x 3.0 cm and worrisome for primary lung neoplasm.  There are 0.5 cm small right upper lobe pulmonary nodules, all measuring less than 0.4 cm suspicious for pulmonary metastatic nodules.  There was destructive lytic lesion involving the T1 vertebral body and transverse process and possibly part of the first right rib.  The scan also showed small low-attenuation lesion in the liver suspicious for benign cyst.  She also had a 2.0 x 2.0 cm left adrenal gland lesion consistent with metastatic disease. The patient was referred to me today for evaluation and recommendation regarding her condition. When seen today she is feeling fine with no concerning complaints except for mild cough and pain in the right arm and back around the right shoulder.  She has no hemoptysis.  The patient denied having any nausea, vomiting, diarrhea or constipation.  She denied having any headache or visual changes.  She has no weight loss or night sweats. Family history significant for father with throat cancer.  Mother had lung cancer.  Brother had kidney cancer and sister had breast  cancer. The patient is single and has 2 daughters she was accompanied today by her daughter, Lynn Wiley.  She is currently retired and used to work as a Lawyer in [employer].  She has a history of smoking less than 1 pack/day for around 43 years and quit in 2017.  She drinks alcohol occasionally and no history of drug abuse.   HPI  Past Medical History:  Diagnosis Date   Hypertension    Stroke [facility])     No past surgical history on file.  No family history on file.  Social History Social History   Tobacco Use   Smoking status: Former    Packs/day: 0.50    Types: Cigarettes    Quit date: 2017    Years since quitting: 5.5   Smokeless tobacco: Never    No Known Allergies  Current Outpatient Medications  Medication Sig Dispense Refill   amLODipine (NORVASC) 10 MG tablet Take 10 mg by mouth 2 (two) times daily.     Ascorbic Acid (VITAMIN C PO) Take 1 tablet by mouth daily.     ASPIRIN LOW DOSE 81 MG EC tablet Take 81 mg by mouth daily.     carvedilol (COREG) 12.5 MG tablet Take 12.5 mg by mouth 2 (two) times daily.     cyclobenzaprine (FLEXERIL) 5 MG tablet Take 5 mg by mouth 3 (three) times daily as needed for muscle spasms.     dexamethasone (DECADRON) 4 MG tablet Take 1 tablet (4 mg total) by mouth 3 (three) times daily. Take with food. 90 tablet 0   Menthol-Camphor (  TIGER BALM ARTHRITIS RUB EX) Apply 1 application topically as needed (arm/hand pain).     Omega-3 Fatty Acids (FISH OIL PO) Take 1 capsule by mouth daily.     omeprazole (PRILOSEC) 20 MG capsule Take 1 capsule (20 mg total) by mouth daily. Take while on Dexamethasone to protect stomach. 30 capsule 2   oxyCODONE-acetaminophen (PERCOCET) 5-325 MG tablet Take 1 tablet by mouth every 4 (four) hours as needed. 20 tablet 0   pravastatin (PRAVACHOL) 40 MG tablet Take 40 mg by mouth at bedtime.     No current facility-administered medications for this visit.    Review of Systems  Constitutional: positive for  fatigue Eyes: negative Ears, nose, mouth, throat, and face: negative Respiratory: positive for cough Cardiovascular: negative Gastrointestinal: negative Genitourinary:negative Integument/breast: negative Hematologic/lymphatic: negative Musculoskeletal:positive for back pain and neck pain Neurological: negative Behavioral/Psych: negative Endocrine: negative Allergic/Immunologic: negative  Physical Exam  OVA:NVBTY, healthy, no distress, well nourished, and well developed SKIN: skin color, texture, turgor are normal, no rashes or significant lesions HEAD: Normocephalic, No masses, lesions, tenderness or abnormalities EYES: normal, PERRLA, Conjunctiva are pink and non-injected EARS: External ears normal, Canals clear OROPHARYNX:no exudate, no erythema, and lips, buccal mucosa, and tongue normal  NECK: supple, no adenopathy, no JVD LYMPH:  no palpable lymphadenopathy, no hepatosplenomegaly BREAST:not examined LUNGS: clear to auscultation , and palpation HEART: regular rate & rhythm, no murmurs, and no gallops ABDOMEN:abdomen soft, non-tender, normal bowel sounds, and no masses or organomegaly BACK: Back symmetric, no curvature., No CVA tenderness EXTREMITIES:no joint deformities, effusion, or inflammation, no edema  NEURO: alert & oriented x 3 with fluent speech, no focal motor/sensory deficits Skin exam, significant neurofibromatosis  PERFORMANCE STATUS: ECOG 1  LABORATORY DATA: Lab Results  Component Value Date   WBC 21.1 (H) 06/15/2021   HGB 13.2 06/15/2021   HCT 41.6 06/15/2021   MCV 93.5 06/15/2021   PLT 510 (H) 06/15/2021      Chemistry      Component Value Date/Time   NA 143 06/15/2021 1044   K 3.6 06/15/2021 1044   CL 108 06/15/2021 1044   CO2 24 06/15/2021 1044   BUN 22 06/15/2021 1044   CREATININE 0.83 06/15/2021 1044      Component Value Date/Time   CALCIUM 8.8 (L) 06/15/2021 1044   ALKPHOS 72 06/15/2021 1044   AST 15 06/15/2021 1044   ALT 12  06/15/2021 1044   BILITOT 0.3 06/15/2021 1044       RADIOGRAPHIC STUDIES: CT CHEST ABDOMEN PELVIS W CONTRAST  Result Date: 06/09/2021 CLINICAL DATA:  Lung lesion seen on thoracic spine films. Bone lesions noted in the cervical spine MRI from 06/07/2021. EXAM: CT CHEST, ABDOMEN, AND PELVIS WITH CONTRAST TECHNIQUE: Multidetector CT imaging of the chest, abdomen and pelvis was performed following the standard protocol during bolus administration of intravenous contrast. CONTRAST:  74mL OMNIPAQUE IOHEXOL 350 MG/ML SOLN COMPARISON:  Thoracic and cervical spine films 06/02/2021 and cervical spine MRI 06/07/2021 FINDINGS: CT CHEST FINDINGS Cardiovascular: The heart is normal in size. No pericardial effusion. There is mild tortuosity, ectasia and calcification of the thoracic aorta but no dissection. Minimal scattered coronary artery calcifications. Mediastinum/Nodes: No mediastinal or hilar mass or lymphadenopathy. The esophagus is grossly normal. Lungs/Pleura: Biapical pleural and parenchymal scarring changes are noted. There is a large lobulated mass in the left upper lobe measuring approximately 3.5 x 3.0 cm and worrisome primary lung neoplasm. There are 5 small right upper lobe pulmonary nodules all measuring less than 4 mm  certainly suspicious for pulmonary metastatic nodules. Patchy areas of atelectasis and subpleural scarring changes at the left lung base. Musculoskeletal: Destructive lytic lesion involving the T1 vertebral body and transverse process and possibly part of the first right rib. This was also demonstrated on the recent MRI. I do not identify any other definite metastatic lesions involving the thoracic spine sternum or ribs. CT ABDOMEN PELVIS FINDINGS Hepatobiliary: Small low-attenuation lesion segment 8 is likely a benign cyst. No other hepatic lesions to suggest metastatic disease. Gallbladder is unremarkable. No common bile duct dilatation. Pancreas: No mass, inflammation or ductal  dilatation. Mild fatty atrophy. Spleen: Normal size.  No focal lesions. Adrenals/Urinary Tract: 2.0 x 2.0 cm left adrenal gland lesion consistent with a metastatic focus. No right-sided renal gland lesion. Right renal cysts are noted. No worrisome renal lesions. The bladder is unremarkable. Stomach/Bowel: The stomach, duodenum, small and colon are unremarkable. No acute inflammatory changes, mass lesions obstructive findings. Terminal ileum is normal. The appendix is normal. Vascular/Lymphatic: Age advanced atherosclerotic calcifications involving the aorta and iliac arteries but no aneurysm or dissection. Branch vessels are patent. The major venous structures are patent. No mesenteric or retroperitoneal mass or adenopathy. Reproductive: The uterus and ovaries are unremarkable. There is a rounded lesion in the left myometrium which is likely a benign fibroid. Other: No free abdominal/free pelvic fluid collections. There is a small periumbilical abdominal wall hernia containing fat. Musculoskeletal: No definite destructive bony changes involving the lumbar spine pelvis. IMPRESSION: 1. 3.5 x 3.0 cm lobulated left upper lobe mass worrisome for primary lung neoplasm. No mediastinal or hilar mass or adenopathy. Recommend PET-CT and referral to multidisciplinary thoracic Oncology clinic. 2. Small right upper lobe pulmonary nodules certainly suspicious for pulmonary metastatic nodules. 3. Destructive lytic lesion involving the right T1 vertebral body and transverse process and possibly part of the first right rib. Other thoracic spine metastatic disease noted on recent cervical spine MRI. No other definite metastatic lesions involving the thoracic or lumbar spine. 4. 2.0 x 2.0 cm left adrenal gland lesion consistent with metastatic disease. 5. No evidence of hepatic metastatic disease. 6. Age advanced atherosclerotic calcifications involving the thoracic and abdominal aorta and iliac arteries. Aortic Atherosclerosis  (ICD10-I70.0). Electronically Signed   By: Marijo Sanes M.D.   On: 06/09/2021 13:47    ASSESSMENT: This is a very pleasant 65 years old African-American female with highly suspicious stage IV non-small cell lung cancer presented with left upper lobe lung mass in addition to small pulmonary nodules as well as left adrenal gland lesion and T1 lytic lesion.   PLAN: I had a lengthy discussion with the patient and her daughter today about her current condition and further investigation to confirm her diagnosis. I personally and independently reviewed the scan images and discussed the result and showed the images to the patient and her daughter. I recommended for the patient to complete the staging work-up by ordering a PET scan as well as MRI of the brain to rule out any other metastatic disease. I will refer the patient to pulmonary medicine for consideration of bronchoscopy with endobronchial ultrasound and biopsy of the left upper lobe lung mass for confirmation of the tissue diagnosis. Have the tissue diagnosis is consistent with non-small cell lung cancer, adenocarcinoma, we will send the biopsy for molecular studies and PD-L1 expression. For the T1 lytic lesion, the patient had MRI of the cervical spine and she is currently followed by Dr. Isidore Moos for consideration of palliative radiotherapy to this area. I  will see the patient back for follow-up visit in around 2 weeks for evaluation and more detailed discussion of her treatment options based on the final staging work-up and molecular studies. The patient was advised to call immediately if she has any concerning symptoms in the interval.  The patient voices understanding of current disease status and treatment options and is in agreement with the current care plan.  All questions were answered. The patient knows to call the clinic with any problems, questions or concerns. We can certainly see the patient much sooner if necessary.  Thank you so  much for allowing me to participate in the care of [facility]. I will continue to follow up the patient with you and assist in her care.  The total time spent in the appointment was 60 minutes.  Disclaimer: This note was dictated with voice recognition software. Similar sounding words can inadvertently be transcribed and may not be corrected upon review.   Eilleen Kempf June 15, 2021, 12:38 PM

## 2021-06-15 NOTE — Progress Notes (Signed)
Oncology Nurse Navigator Documentation  Oncology Nurse Navigator Flowsheets 06/15/2021  Abnormal Finding Date 06/09/2021  Diagnosis Status Additional Work Up  Navigator Follow Up Date: 06/17/2021  Navigator Follow Up Reason: Appointment Review  Navigator Location [facility]  Navigator Encounter Type Clinic/MDC;Initial MedOnc  Patient Visit Type Initial;MedOnc  Treatment Phase Abnormal Scans  Barriers/Navigation Needs Education;Coordination of Care  Education Other  Interventions Psycho-Social Support;Coordination of Care;Education  Acuity Level 2-Minimal Needs (1-2 Barriers Identified)  Coordination of Care Other  Education Method Verbal  Time Spent with Patient 45

## 2021-06-16 ENCOUNTER — Encounter: Payer: Self-pay | Admitting: *Deleted

## 2021-06-16 ENCOUNTER — Encounter: Payer: Self-pay | Admitting: General Practice

## 2021-06-16 ENCOUNTER — Encounter: Payer: Self-pay | Admitting: Pulmonary Disease

## 2021-06-16 ENCOUNTER — Ambulatory Visit: Payer: Medicare Other | Admitting: Pulmonary Disease

## 2021-06-16 VITALS — BP 118/78 | HR 79 | Ht 64.0 in | Wt 116.6 lb

## 2021-06-16 DIAGNOSIS — C7951 Secondary malignant neoplasm of bone: Secondary | ICD-10-CM

## 2021-06-16 DIAGNOSIS — Z87891 Personal history of nicotine dependence: Secondary | ICD-10-CM | POA: Diagnosis not present

## 2021-06-16 DIAGNOSIS — R918 Other nonspecific abnormal finding of lung field: Secondary | ICD-10-CM

## 2021-06-16 NOTE — Patient Instructions (Addendum)
Thank you for visiting Dr. Valeta Harms at [facility]. Today we recommend the following: Orders Placed This Encounter  Procedures   Procedural/ Surgical Case Request: VIDEO BRONCHOSCOPY WITH ENDOBRONCHIAL NAVIGATION, VIDEO BRONCHOSCOPY WITH ENDOBRONCHIAL ULTRASOUND   CT Super D Chest Wo Contrast   Ambulatory referral to Pulmonology   Bronchoscopy date on 06/25/2021  Return in about 4 weeks (around 07/14/2021), or if symptoms worsen or fail to improve, for with APP or Dr. Valeta Harms.    Please do your part to reduce the spread of COVID-19.

## 2021-06-16 NOTE — H&P (View-Only) (Signed)
Synopsis: Referred in July 2022 for lung mass by Dr. Julien Nordmann and PCP: Marzetta Board, NP  Subjective:   PATIENT ID: Lynn Wiley GENDER: female DOB: 1956/06/15, MRN: [id number]  Chief Complaint  Patient presents with   Consult    Reports weight loss and pain in her shoulder that led to the abnormal lung imaging. She originally had imaging for her neck pain and saw something abnormal in her lung. She has a cough for years. Smoked 1/2 pack/day  for 42 years.     This is a 65 year old female, past medical history of hypertension, neurofibromatosis.  Patient has had a ongoing weight loss, shoulder pain, found to have weakness in her right arm.  She has smoked a half a pack a day for 42+ years.  Sent for CT scan imaging.  Found to have a large T1 thoracic lesion compressing nervous structure affecting her right arm.  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  Patient was referred here for discuss bronchoscopic evaluation for tissue diagnosis and biopsy.  Patient has already been seen in establish care with medical oncology Dr. Earlie Server.    Past Medical History:  Diagnosis Date   Hypertension    Neurofibromatosis ([place])    Stroke ([place])      Family History  Problem Relation Age of Onset   Neurofibromatosis Mother    Throat cancer Father    Neurofibromatosis Sister    Kidney cancer Brother    Gastric cancer Brother      Past Surgical History:  Procedure Laterality Date   NO PAST SURGERIES      Social History   Socioeconomic History   Marital status: Single    Spouse name: Not on file   Number of children: Not on file   Years of education: Not on file   Highest education level: Not on file  Occupational History   Not on file  Tobacco Use   Smoking status: Former    Packs/day: 0.50    Types: Cigarettes    Quit date: 2017    Years since quitting: 5.5   Smokeless tobacco: Never  Substance and Sexual  Activity   Alcohol use: Not on file   Drug use: Not on file   Sexual activity: Not on file  Other Topics Concern   Not on file  Social History Narrative   Not on file   Social Determinants of Health   Financial Resource Strain: Low Risk    Difficulty of Paying Living Expenses: Not hard at all  Food Insecurity: No Food Insecurity   Worried About Charity fundraiser in the Last Year: Never true   Ran Out of Food in the Last Year: Never true  Transportation Needs: No Transportation Needs   Lack of Transportation (Medical): No   Lack of Transportation (Non-Medical): No  Physical Activity: Not on file  Stress: Not on file  Social Connections: Socially Isolated   Frequency of Communication with Friends and Family: More than three times a week   Frequency of Social Gatherings with Friends and Family: More than three times a week   Attends Religious Services: Never   Marine scientist or Organizations: No   Attends Archivist Meetings: Never   Marital Status: Never married  Human resources officer Violence: Not on file     No Known Allergies   Outpatient Medications Prior to Visit  Medication Sig Dispense Refill   amLODipine ([place])  10 MG tablet Take 10 mg by mouth 2 (two) times daily.     Ascorbic Acid (VITAMIN C PO) Take 1 tablet by mouth daily.     ASPIRIN LOW DOSE 81 MG EC tablet Take 81 mg by mouth daily.     carvedilol (COREG) 12.5 MG tablet Take 12.5 mg by mouth 2 (two) times daily.     cyclobenzaprine (FLEXERIL) 5 MG tablet Take 5 mg by mouth 3 (three) times daily as needed for muscle spasms.     dexamethasone (DECADRON) 4 MG tablet Take 1 tablet (4 mg total) by mouth 3 (three) times daily. Take with food. 90 tablet 0   LORazepam (ATIVAN) 0.5 MG tablet 1 tablet p.o. 30 minutes before the MRI.  Repeat once if needed 2 tablet 0   Menthol-Camphor (TIGER BALM ARTHRITIS RUB EX) Apply 1 application topically as needed (arm/hand pain).     Omega-3 Fatty Acids (FISH OIL  PO) Take 1 capsule by mouth daily.     omeprazole (PRILOSEC) 20 MG capsule Take 1 capsule (20 mg total) by mouth daily. Take while on Dexamethasone to protect stomach. 30 capsule 2   oxyCODONE-acetaminophen (PERCOCET) 5-325 MG tablet Take 1 tablet by mouth every 4 (four) hours as needed. 20 tablet 0   pravastatin (PRAVACHOL) 40 MG tablet Take 40 mg by mouth at bedtime.     No facility-administered medications prior to visit.    Review of Systems  Constitutional:  Negative for chills, fever, malaise/fatigue and weight loss.  HENT:  Negative for hearing loss, sore throat and tinnitus.   Eyes:  Negative for blurred vision and double vision.  Respiratory:  Positive for cough, sputum production and shortness of breath. Negative for hemoptysis, wheezing and stridor.   Cardiovascular:  Negative for chest pain, palpitations, orthopnea, leg swelling and PND.  Gastrointestinal:  Negative for abdominal pain, constipation, diarrhea, heartburn, nausea and vomiting.  Genitourinary:  Negative for dysuria, hematuria and urgency.  Musculoskeletal:  Positive for back pain and neck pain. Negative for joint pain and myalgias.  Skin:  Negative for itching and rash.  Neurological:  Positive for focal weakness and weakness. Negative for dizziness, tingling and headaches.  Endo/Heme/Allergies:  Negative for environmental allergies. Does not bruise/bleed easily.  Psychiatric/Behavioral:  Negative for depression. The patient is not nervous/anxious and does not have insomnia.   All other systems reviewed and are negative.   Objective:  Physical Exam Vitals reviewed.  Constitutional:      General: She is not in acute distress.    Appearance: She is well-developed.  HENT:     Head: Normocephalic and atraumatic.     Mouth/Throat:     Pharynx: No oropharyngeal exudate.  Eyes:     Conjunctiva/sclera: Conjunctivae normal.     Pupils: Pupils are equal, round, and reactive to light.  Neck:     Vascular: No JVD.      Trachea: No tracheal deviation.     Comments: Loss of supraclavicular fat Cardiovascular:     Rate and Rhythm: Normal rate and regular rhythm.     Heart sounds: S1 normal and S2 normal.     Comments: Distant heart tones Pulmonary:     Effort: No tachypnea or accessory muscle usage.     Breath sounds: No stridor. Decreased breath sounds (throughout all lung fields) present. No wheezing, rhonchi or rales.  Abdominal:     General: Bowel sounds are normal. There is no distension.     Palpations: Abdomen is soft.  Tenderness: There is no abdominal tenderness.  Musculoskeletal:        General: Deformity (muscle wasting ) present.  Skin:    General: Skin is warm and dry.     Capillary Refill: Capillary refill takes less than 2 seconds.     Findings: No rash.  Neurological:     Mental Status: She is alert and oriented to person, place, and time.     Motor: Weakness present.     Gait: Gait abnormal.  Psychiatric:        Behavior: Behavior normal.     Vitals:   06/16/21 1402  BP: 118/78  Pulse: 79  SpO2: 99%  Weight: 116 lb 9.6 oz (52.9 kg)  Height: 5\' 4"  (1.626 m)   99% on  RA BMI Readings from Last 3 Encounters:  06/16/21 20.01 kg/m   Wt Readings from Last 3 Encounters:  06/16/21 116 lb 9.6 oz (52.9 kg)  06/15/21 116 lb 6 oz (52.8 kg)  06/15/21 118 lb 5 oz (53.7 kg)     CBC    Component Value Date/Time   WBC 21.1 (H) 06/15/2021 1044   WBC 19.3 (H) 06/09/2021 1023   RBC 4.45 06/15/2021 1044   HGB 13.2 06/15/2021 1044   HCT 41.6 06/15/2021 1044   PLT 510 (H) 06/15/2021 1044   MCV 93.5 06/15/2021 1044   MCH 29.7 06/15/2021 1044   MCHC 31.7 06/15/2021 1044   RDW 15.0 06/15/2021 1044   LYMPHSABS 1.1 06/15/2021 1044   MONOABS 0.6 06/15/2021 1044   EOSABS 0.1 06/15/2021 1044   BASOSABS 0.0 06/15/2021 1044     Chest Imaging: CT scan of the chest 06/09/2021: Patient found to have a 3.5 x 3 cm lobulated left upper lobe mass concerning for a primary  malignancy.  Also other small pulmonary nodules noted destructive lesion within the T1 vertebra and 2 cm left adrenal gland metastatic lesion. The patient's images have been independently reviewed by me.    Pulmonary Functions Testing Results: No flowsheet data found.  FeNO:   Pathology:   Echocardiogram:   Heart Catheterization:     Assessment & Plan:     ICD-10-CM   1. Lung mass  R91.8 Ambulatory referral to Pulmonology    Procedural/ Surgical Case Request: VIDEO BRONCHOSCOPY WITH ENDOBRONCHIAL NAVIGATION, VIDEO BRONCHOSCOPY WITH ENDOBRONCHIAL ULTRASOUND    CT Super D Chest Wo Contrast    2. Bone metastases ([place])  C79.51     3. Former smoker  Z87.891       Discussion:  This is a 65 year old female, former smoker found to have lung mass, bony metastasis, large T1 lesion affecting mobility and motor function of the right arm.  Overall clinical images concerning for an advanced age bronchogenic carcinoma.  She will need soft tissue biopsy for molecular studies due to her advanced stage malignancy.  Plan: We discussed bronchoscopy for tissue sampling. Patient is agreeable to proceed. We discussed the risk benefits and alternatives of bronchoscopy versus percutaneous needle biopsy. We will plan for bronchoscopy on 06/25/2021. Patient is agreeable to proceed at this time.    Current Outpatient Medications:    amLODipine (NORVASC) 10 MG tablet, Take 10 mg by mouth 2 (two) times daily., Disp: , Rfl:    Ascorbic Acid (VITAMIN C PO), Take 1 tablet by mouth daily., Disp: , Rfl:    ASPIRIN LOW DOSE 81 MG EC tablet, Take 81 mg by mouth daily., Disp: , Rfl:    carvedilol (COREG) 12.5 MG tablet, Take 12.5  mg by mouth 2 (two) times daily., Disp: , Rfl:    cyclobenzaprine (FLEXERIL) 5 MG tablet, Take 5 mg by mouth 3 (three) times daily as needed for muscle spasms., Disp: , Rfl:    dexamethasone (DECADRON) 4 MG tablet, Take 1 tablet (4 mg total) by mouth 3 (three) times daily. Take with  food., Disp: 90 tablet, Rfl: 0   LORazepam (ATIVAN) 0.5 MG tablet, 1 tablet p.o. 30 minutes before the MRI.  Repeat once if needed, Disp: 2 tablet, Rfl: 0   Menthol-Camphor (TIGER BALM ARTHRITIS RUB EX), Apply 1 application topically as needed (arm/hand pain)., Disp: , Rfl:    Omega-3 Fatty Acids (FISH OIL PO), Take 1 capsule by mouth daily., Disp: , Rfl:    omeprazole (PRILOSEC) 20 MG capsule, Take 1 capsule (20 mg total) by mouth daily. Take while on Dexamethasone to protect stomach., Disp: 30 capsule, Rfl: 2   oxyCODONE-acetaminophen (PERCOCET) 5-325 MG tablet, Take 1 tablet by mouth every 4 (four) hours as needed., Disp: 20 tablet, Rfl: 0   pravastatin (PRAVACHOL) 40 MG tablet, Take 40 mg by mouth at bedtime., Disp: , Rfl:     Garner Nash, DO [facility] 06/16/2021 7:43 PM

## 2021-06-16 NOTE — Progress Notes (Signed)
Synopsis: Referred in July 2022 for lung mass by Dr. Julien Nordmann and PCP: Marzetta Board, NP  Subjective:   PATIENT ID: Lynn Wiley GENDER: female DOB: 10-06-1956, MRN: [id number]  Chief Complaint  Patient presents with   Consult    Reports weight loss and pain in her shoulder that led to the abnormal lung imaging. She originally had imaging for her neck pain and saw something abnormal in her lung. She has a cough for years. Smoked 1/2 pack/day  for 42 years.     This is a 65 year old female, past medical history of hypertension, neurofibromatosis.  Patient has had a ongoing weight loss, shoulder pain, found to have weakness in her right arm.  She has smoked a half a pack a day for 42+ years.  Sent for CT scan imaging.  Found to have a large T1 thoracic lesion compressing nervous structure affecting her right arm.  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  Patient was referred here for discuss bronchoscopic evaluation for tissue diagnosis and biopsy.  Patient has already been seen in establish care with medical oncology Dr. Earlie Server.    Past Medical History:  Diagnosis Date   Hypertension    Neurofibromatosis ([place])    Stroke ([place])      Family History  Problem Relation Age of Onset   Neurofibromatosis Mother    Throat cancer Father    Neurofibromatosis Sister    Kidney cancer Brother    Gastric cancer Brother      Past Surgical History:  Procedure Laterality Date   NO PAST SURGERIES      Social History   Socioeconomic History   Marital status: Single    Spouse name: Not on file   Number of children: Not on file   Years of education: Not on file   Highest education level: Not on file  Occupational History   Not on file  Tobacco Use   Smoking status: Former    Packs/day: 0.50    Types: Cigarettes    Quit date: 2017    Years since quitting: 5.5   Smokeless tobacco: Never  Substance and Sexual  Activity   Alcohol use: Not on file   Drug use: Not on file   Sexual activity: Not on file  Other Topics Concern   Not on file  Social History Narrative   Not on file   Social Determinants of Health   Financial Resource Strain: Low Risk    Difficulty of Paying Living Expenses: Not hard at all  Food Insecurity: No Food Insecurity   Worried About Charity fundraiser in the Last Year: Never true   Ran Out of Food in the Last Year: Never true  Transportation Needs: No Transportation Needs   Lack of Transportation (Medical): No   Lack of Transportation (Non-Medical): No  Physical Activity: Not on file  Stress: Not on file  Social Connections: Socially Isolated   Frequency of Communication with Friends and Family: More than three times a week   Frequency of Social Gatherings with Friends and Family: More than three times a week   Attends Religious Services: Never   Marine scientist or Organizations: No   Attends Archivist Meetings: Never   Marital Status: Never married  Human resources officer Violence: Not on file     No Known Allergies   Outpatient Medications Prior to Visit  Medication Sig Dispense Refill   amLODipine ([place])  10 MG tablet Take 10 mg by mouth 2 (two) times daily.     Ascorbic Acid (VITAMIN C PO) Take 1 tablet by mouth daily.     ASPIRIN LOW DOSE 81 MG EC tablet Take 81 mg by mouth daily.     carvedilol (COREG) 12.5 MG tablet Take 12.5 mg by mouth 2 (two) times daily.     cyclobenzaprine (FLEXERIL) 5 MG tablet Take 5 mg by mouth 3 (three) times daily as needed for muscle spasms.     dexamethasone (DECADRON) 4 MG tablet Take 1 tablet (4 mg total) by mouth 3 (three) times daily. Take with food. 90 tablet 0   LORazepam (ATIVAN) 0.5 MG tablet 1 tablet p.o. 30 minutes before the MRI.  Repeat once if needed 2 tablet 0   Menthol-Camphor (TIGER BALM ARTHRITIS RUB EX) Apply 1 application topically as needed (arm/hand pain).     Omega-3 Fatty Acids (FISH OIL  PO) Take 1 capsule by mouth daily.     omeprazole (PRILOSEC) 20 MG capsule Take 1 capsule (20 mg total) by mouth daily. Take while on Dexamethasone to protect stomach. 30 capsule 2   oxyCODONE-acetaminophen (PERCOCET) 5-325 MG tablet Take 1 tablet by mouth every 4 (four) hours as needed. 20 tablet 0   pravastatin (PRAVACHOL) 40 MG tablet Take 40 mg by mouth at bedtime.     No facility-administered medications prior to visit.    Review of Systems  Constitutional:  Negative for chills, fever, malaise/fatigue and weight loss.  HENT:  Negative for hearing loss, sore throat and tinnitus.   Eyes:  Negative for blurred vision and double vision.  Respiratory:  Positive for cough, sputum production and shortness of breath. Negative for hemoptysis, wheezing and stridor.   Cardiovascular:  Negative for chest pain, palpitations, orthopnea, leg swelling and PND.  Gastrointestinal:  Negative for abdominal pain, constipation, diarrhea, heartburn, nausea and vomiting.  Genitourinary:  Negative for dysuria, hematuria and urgency.  Musculoskeletal:  Positive for back pain and neck pain. Negative for joint pain and myalgias.  Skin:  Negative for itching and rash.  Neurological:  Positive for focal weakness and weakness. Negative for dizziness, tingling and headaches.  Endo/Heme/Allergies:  Negative for environmental allergies. Does not bruise/bleed easily.  Psychiatric/Behavioral:  Negative for depression. The patient is not nervous/anxious and does not have insomnia.   All other systems reviewed and are negative.   Objective:  Physical Exam Vitals reviewed.  Constitutional:      General: She is not in acute distress.    Appearance: She is well-developed.  HENT:     Head: Normocephalic and atraumatic.     Mouth/Throat:     Pharynx: No oropharyngeal exudate.  Eyes:     Conjunctiva/sclera: Conjunctivae normal.     Pupils: Pupils are equal, round, and reactive to light.  Neck:     Vascular: No JVD.      Trachea: No tracheal deviation.     Comments: Loss of supraclavicular fat Cardiovascular:     Rate and Rhythm: Normal rate and regular rhythm.     Heart sounds: S1 normal and S2 normal.     Comments: Distant heart tones Pulmonary:     Effort: No tachypnea or accessory muscle usage.     Breath sounds: No stridor. Decreased breath sounds (throughout all lung fields) present. No wheezing, rhonchi or rales.  Abdominal:     General: Bowel sounds are normal. There is no distension.     Palpations: Abdomen is soft.  Tenderness: There is no abdominal tenderness.  Musculoskeletal:        General: Deformity (muscle wasting ) present.  Skin:    General: Skin is warm and dry.     Capillary Refill: Capillary refill takes less than 2 seconds.     Findings: No rash.  Neurological:     Mental Status: She is alert and oriented to person, place, and time.     Motor: Weakness present.     Gait: Gait abnormal.  Psychiatric:        Behavior: Behavior normal.     Vitals:   06/16/21 1402  BP: 118/78  Pulse: 79  SpO2: 99%  Weight: 116 lb 9.6 oz (52.9 kg)  Height: 5\' 4"  (1.626 m)   99% on  RA BMI Readings from Last 3 Encounters:  06/16/21 20.01 kg/m   Wt Readings from Last 3 Encounters:  06/16/21 116 lb 9.6 oz (52.9 kg)  06/15/21 116 lb 6 oz (52.8 kg)  06/15/21 118 lb 5 oz (53.7 kg)     CBC    Component Value Date/Time   WBC 21.1 (H) 06/15/2021 1044   WBC 19.3 (H) 06/09/2021 1023   RBC 4.45 06/15/2021 1044   HGB 13.2 06/15/2021 1044   HCT 41.6 06/15/2021 1044   PLT 510 (H) 06/15/2021 1044   MCV 93.5 06/15/2021 1044   MCH 29.7 06/15/2021 1044   MCHC 31.7 06/15/2021 1044   RDW 15.0 06/15/2021 1044   LYMPHSABS 1.1 06/15/2021 1044   MONOABS 0.6 06/15/2021 1044   EOSABS 0.1 06/15/2021 1044   BASOSABS 0.0 06/15/2021 1044     Chest Imaging: CT scan of the chest 06/09/2021: Patient found to have a 3.5 x 3 cm lobulated left upper lobe mass concerning for a primary  malignancy.  Also other small pulmonary nodules noted destructive lesion within the T1 vertebra and 2 cm left adrenal gland metastatic lesion. The patient's images have been independently reviewed by me.    Pulmonary Functions Testing Results: No flowsheet data found.  FeNO:   Pathology:   Echocardiogram:   Heart Catheterization:     Assessment & Plan:     ICD-10-CM   1. Lung mass  R91.8 Ambulatory referral to Pulmonology    Procedural/ Surgical Case Request: VIDEO BRONCHOSCOPY WITH ENDOBRONCHIAL NAVIGATION, VIDEO BRONCHOSCOPY WITH ENDOBRONCHIAL ULTRASOUND    CT Super D Chest Wo Contrast    2. Bone metastases ([place])  C79.51     3. Former smoker  Z87.891       Discussion:  This is a 65 year old female, former smoker found to have lung mass, bony metastasis, large T1 lesion affecting mobility and motor function of the right arm.  Overall clinical images concerning for an advanced age bronchogenic carcinoma.  She will need soft tissue biopsy for molecular studies due to her advanced stage malignancy.  Plan: We discussed bronchoscopy for tissue sampling. Patient is agreeable to proceed. We discussed the risk benefits and alternatives of bronchoscopy versus percutaneous needle biopsy. We will plan for bronchoscopy on 06/25/2021. Patient is agreeable to proceed at this time.    Current Outpatient Medications:    amLODipine ([place]) 10 MG tablet, Take 10 mg by mouth 2 (two) times daily., Disp: , Rfl:    Ascorbic Acid (VITAMIN C PO), Take 1 tablet by mouth daily., Disp: , Rfl:    ASPIRIN LOW DOSE 81 MG EC tablet, Take 81 mg by mouth daily., Disp: , Rfl:    carvedilol (COREG) 12.5 MG tablet, Take 12.5  mg by mouth 2 (two) times daily., Disp: , Rfl:    cyclobenzaprine (FLEXERIL) 5 MG tablet, Take 5 mg by mouth 3 (three) times daily as needed for muscle spasms., Disp: , Rfl:    dexamethasone (DECADRON) 4 MG tablet, Take 1 tablet (4 mg total) by mouth 3 (three) times daily. Take with  food., Disp: 90 tablet, Rfl: 0   LORazepam (ATIVAN) 0.5 MG tablet, 1 tablet p.o. 30 minutes before the MRI.  Repeat once if needed, Disp: 2 tablet, Rfl: 0   Menthol-Camphor (TIGER BALM ARTHRITIS RUB EX), Apply 1 application topically as needed (arm/hand pain)., Disp: , Rfl:    Omega-3 Fatty Acids (FISH OIL PO), Take 1 capsule by mouth daily., Disp: , Rfl:    omeprazole (PRILOSEC) 20 MG capsule, Take 1 capsule (20 mg total) by mouth daily. Take while on Dexamethasone to protect stomach., Disp: 30 capsule, Rfl: 2   oxyCODONE-acetaminophen (PERCOCET) 5-325 MG tablet, Take 1 tablet by mouth every 4 (four) hours as needed., Disp: 20 tablet, Rfl: 0   pravastatin (PRAVACHOL) 40 MG tablet, Take 40 mg by mouth at bedtime., Disp: , Rfl:     Garner Nash, DO [facility] 06/16/2021 7:43 PM

## 2021-06-16 NOTE — Progress Notes (Signed)
Lynn Wiley Psychosocial Distress Screening Clinical Social Work  Clinical Social Work was referred by distress screening protocol.  The patient scored a 5 on the Psychosocial Distress Thermometer which indicates moderate distress. Clinical Social Worker contacted patient by phone to assess for distress and other psychosocial needs. Patient asked that CSW speak w daughter Lynn Wiley.  Issue began w shoulder pain, which has now been diagnosed as lung cancer.  Patient has difficulty "getting around", cannot bathe herself, daughter has quit her job as no home health is available.  She does not qualify for Medicaid.  Plan is to reunite w long term fiance - once that happens daughter can return to work.  Family is providing all the care necessary for patient - transportation, physical help at home, food.  Briefly described role of Support Center and how to access services.  Affirmed daughter's commitment to mother and appreciation for her service to patient. They will call us if/when needed.    ONCBCN DISTRESS SCREENING 06/15/2021  Screening Type Initial Screening  Distress experienced in past week (1-10) 5  Physical Problem type Pain;Getting around;Tingling hands/feet  Physician notified of physical symptoms Yes  Referral to clinical psychology No  Referral to clinical social work Yes  Referral to dietition Yes  Referral to financial advocate No  Referral to support programs Yes  Referral to palliative care No    Clinical Social Worker follow up needed: No.  If yes, follow up plan:  Beverely Pace, [place], LCSW Clinical Social Worker Phone:  [phone]

## 2021-06-16 NOTE — Progress Notes (Signed)
I followed up on Mr. Portal's plan of care.  She is set up for bronchoscopy and her mri brain has been authorized.  I followed up with the authorization team to check on PET authorization.  Wait for an update.

## 2021-06-17 ENCOUNTER — Other Ambulatory Visit: Payer: Self-pay

## 2021-06-17 ENCOUNTER — Inpatient Hospital Stay: Payer: Medicare Other

## 2021-06-17 ENCOUNTER — Inpatient Hospital Stay (HOSPITAL_BASED_OUTPATIENT_CLINIC_OR_DEPARTMENT_OTHER): Payer: Medicare Other | Admitting: Genetic Counselor

## 2021-06-17 ENCOUNTER — Encounter: Payer: Self-pay | Admitting: Genetic Counselor

## 2021-06-17 ENCOUNTER — Telehealth: Payer: Self-pay | Admitting: Pulmonary Disease

## 2021-06-17 DIAGNOSIS — R918 Other nonspecific abnormal finding of lung field: Secondary | ICD-10-CM | POA: Diagnosis not present

## 2021-06-17 DIAGNOSIS — Q8500 Neurofibromatosis, unspecified: Secondary | ICD-10-CM | POA: Diagnosis not present

## 2021-06-17 DIAGNOSIS — Z8051 Family history of malignant neoplasm of kidney: Secondary | ICD-10-CM | POA: Insufficient documentation

## 2021-06-17 DIAGNOSIS — Z8279 Family history of other congenital malformations, deformations and chromosomal abnormalities: Secondary | ICD-10-CM

## 2021-06-17 DIAGNOSIS — Z803 Family history of malignant neoplasm of breast: Secondary | ICD-10-CM | POA: Insufficient documentation

## 2021-06-17 LAB — GENETIC SCREENING ORDER

## 2021-06-17 NOTE — Telephone Encounter (Signed)
ENB has been scheduled for 8/5 at 7:30.  CT scheduled for 8/3 at 9:30 at [facility].  Pt will need to go for covid test on 8/2.  I have called pt & left her vm to call me back for appt info.

## 2021-06-17 NOTE — Telephone Encounter (Signed)
Spoke to pt's dtr Adreka and gave her appt info.

## 2021-06-17 NOTE — Progress Notes (Signed)
REFERRING PROVIDER: Garner Nash, DO [address] [place],  [place] [address]  PRIMARY PROVIDER:  Marzetta Board, NP  PRIMARY REASON FOR VISIT:  1. Mass of upper lobe of left lung   2. Neurofibromatosis ([place])   3. Family history of neurofibromatosis   4. Family history of kidney cancer   5. Family history of breast cancer      HISTORY OF PRESENT ILLNESS:   [name], a 65 y.o. female, was seen for a [place] cancer genetics consultation at the request of Dr. Valeta Harms due to a personal and family history of cancer, and a personal family history of Neurofibromatosis, type 1.  Lynn Wiley presents to clinic today to discuss the possibility of a hereditary predisposition to cancer, genetic testing, and to further clarify her future cancer risks, as well as potential cancer risks for family members.   In July 202, at the age of 36, [name] was diagnosed with a lung mass that is concerning for cancer. She has a diagnosis of NF1, and was followed for this in [place], [place], but has not been seen for this condition since being back in [place].      CANCER HISTORY:  Oncology History   No history exists.     RISK FACTORS:  Menarche was at age 39.  First live birth at age 76.  OCP use for approximately 6 years.  Ovaries intact: yes.  Hysterectomy: no.  Menopausal status: postmenopausal.  HRT use: 0 years. Colonoscopy: yes; normal. Mammogram within the last year: no. Number of breast biopsies: 0. Up to date with pelvic exams: no. Any excessive radiation exposure in the past: no  Past Medical History:  Diagnosis Date   Family history of breast cancer    Family history of kidney cancer    Family history of neurofibromatosis    Hypertension    Neurofibromatosis ([place])    Stroke [facility])     Past Surgical History:  Procedure Laterality Date   NO PAST SURGERIES      Social History   Socioeconomic History   Marital status: Single    Spouse name: Not on file    Number of children: Not on file   Years of education: Not on file   Highest education level: Not on file  Occupational History   Not on file  Tobacco Use   Smoking status: Former    Packs/day: 0.50    Types: Cigarettes    Quit date: 2017    Years since quitting: 5.5   Smokeless tobacco: Never  Substance and Sexual Activity   Alcohol use: Not on file   Drug use: Not on file   Sexual activity: Not on file  Other Topics Concern   Not on file  Social History Narrative   Not on file   Social Determinants of Health   Financial Resource Strain: Low Risk    Difficulty of Paying Living Expenses: Not hard at all  Food Insecurity: No Food Insecurity   Worried About Charity fundraiser in the Last Year: Never true   [place] in the Last Year: Never true  Transportation Needs: No Transportation Needs   Lack of Transportation (Medical): No   Lack of Transportation (Non-Medical): No  Physical Activity: Not on file  Stress: Not on file  Social Connections: Socially Isolated   Frequency of Communication with Friends and Family: More than three times a week   Frequency of Social Gatherings with Friends and  Family: More than three times a week   Attends Religious Services: Never   Active Member of Clubs or Organizations: No   Attends Archivist Meetings: Never   Marital Status: Never married     FAMILY HISTORY:  We obtained a detailed, 4-generation family history.  Significant diagnoses are listed below: Family History  Problem Relation Age of Onset   Neurofibromatosis Mother    Lung cancer Mother    Throat cancer Father        d. in his 51s   Neurofibromatosis Sister    Breast cancer Sister 82   Kidney cancer Brother        dx 36-60   Gastric cancer Brother        dx 4-60   Heart attack Brother    Neurofibromatosis Maternal Aunt    Neurofibromatosis Maternal Uncle    Breast cancer Paternal Aunt    Breast cancer Paternal Aunt    Cancer Paternal Uncle         NOS   Neurofibromatosis Maternal Grandmother    Cancer Cousin        possible ovarian cancer; 'abdominal cancer'    The patient has two daughters who reportedly do not have NF.  She has a maternal half sister and two full brothers.  Her sister had NF, and has a diagnosis of breast cancer.  Her daughter also has NF.  Both brothers are deceased.  One had kidney and gastric cancer and died at 52-60.  Both parents are deceased.  The patient's father died of throat cancer in his 68's.  He had eight siblings.  Two sisters had breast cancer and one has a daughter who had an abdominal cancer, possibly ovarian.  One of his brothers had a unknown cancer.  His parents are deceased for unknown reasons.  The patient's mother died of lung cancer.  She had NF1.  She had two brothers and a sister who all had NF1.  Both maternal grandparents are deceased.  The grandmother had NF1.  Ms. Konkle is unaware of previous family history of genetic testing for hereditary cancer risks. Patient's maternal ancestors are of African American descent, and paternal ancestors are of African American descent. There is no reported Ashkenazi Jewish ancestry. There is no known consanguinity.  GENETIC COUNSELING ASSESSMENT: [name] is a 65 y.o. female with a personal and family history of cancer and NF1 diagnosis which is somewhat suggestive of a hereditary cancer syndrome and predisposition to cancer given the breast and possible ovarian cancer and the personal and family history of NF1. We, therefore, discussed and recommended the following at today's visit.   DISCUSSION: We discussed that 5 - 10% of breast cancer is hereditary, with most cases associated with BRCA mutations.  There are other genes that can be associated with hereditary breast cancer syndromes.  These include ATM, CHEK2 and PALB2.  The patient has a diagnosis of NF1, and a strong family history of NF1 in her relatives.  We discussed that individuals with NF1 are at  increased risk for cancer, including brain and breast cancers.  However, most people with NF1 do not have cancer.  We reviewed the autosomal dominant inheritance pattern for NF1, and that her daughters each have a 50% chance of having inherited this condition.  Her daughter attending the appointment today states that she and her sister have never had a diagnosis of NF1.  We discussed that testing is beneficial for several reasons including knowing how to follow  individuals and understand if other family members could be at risk for cancer and allow them to undergo genetic testing. The patient and her daughter both voiced interest in a referral to an NF1 clinic or neurologist knowledgeable about NF1.  We reviewed the characteristics, features and inheritance patterns of hereditary cancer syndromes. We also discussed genetic testing, including the appropriate family members to test, the process of testing, insurance coverage and turn-around-time for results. We discussed the implications of a negative, positive, carrier and/or variant of uncertain significant result. We recommended Ms. Chaney pursue genetic testing for the CancerNext-Expanded gene panel. The CancerNext-Expanded gene panel offered by [facility] and includes sequencing and rearrangement analysis for the following 77 genes: AIP, ALK, APC*, ATM*, AXIN2, BAP1, BARD1, BLM, BMPR1A, BRCA1*, BRCA2*, BRIP1*, CDC73, CDH1*, CDK4, CDKN1B, CDKN2A, CHEK2*, CTNNA1, DICER1, FANCC, FH, FLCN, GALNT12, KIF1B, LZTR1, MAX, MEN1, MET, MLH1*, MSH2*, MSH3, MSH6*, MUTYH*, NBN, NF1*, NF2, NTHL1, PALB2*, PHOX2B, PMS2*, POT1, PRKAR1A, PTCH1, PTEN*, RAD51C*, RAD51D*, RB1, RECQL, RET, SDHA, SDHAF2, SDHB, SDHC, SDHD, SMAD4, SMARCA4, SMARCB1, SMARCE1, STK11, SUFU, TMEM127, TP53*, TSC1, TSC2, VHL and XRCC2 (sequencing and deletion/duplication); EGFR, EGLN1, HOXB13, KIT, MITF, PDGFRA, POLD1, and POLE (sequencing only); EPCAM and GREM1 (deletion/duplication only). DNA and RNA  analyses performed for * genes.   Based on Ms. Schemm's personal and family history of cancer, she meets medical criteria for genetic testing. Despite that she meets criteria, she may still have an out of pocket cost. We discussed that if her out of pocket cost for testing is over $100, the laboratory will call and confirm whether she wants to proceed with testing.  If the out of pocket cost of testing is less than $100 she will be billed by the genetic testing laboratory.   PLAN: After considering the risks, benefits, and limitations, Ms. Baetz provided informed consent to pursue genetic testing and the blood sample was sent to [facility] for analysis of the CancerNext-Expanded+RNAinsight. Results should be available within approximately 2-3 weeks' time, at which point they will be disclosed by telephone to Ms. Grose, as will any additional recommendations warranted by these results. Ms. Blakley will receive a summary of her genetic counseling visit and a copy of her results once available. This information will also be available in Epic.   Lastly, we encouraged Ms. Guidice to remain in contact with cancer genetics annually so that we can continuously update the family history and inform her of any changes in cancer genetics and testing that may be of benefit for this family.   Ms. Clinkscales questions were answered to her satisfaction today. Our contact information was provided should additional questions or concerns arise. Thank you for the referral and allowing [place] to share in the care of your patient.   Imogen Maddalena P. Florene Glen, [place], [facility] Licensed, Insurance risk surveyor [email or web address] phone: [phone]  The patient was seen for a total of 35 minutes in face-to-face genetic counseling.  The patient brought her daughter to the appointment. This patient was discussed with Drs. Magrinat, Lindi Adie and/or Burr Medico who agrees with the above.     _______________________________________________________________________ For Office Staff:  Number of people involved in session: 2 Was an Intern/ student involved with case: no

## 2021-06-19 ENCOUNTER — Encounter: Payer: Self-pay | Admitting: Radiation Oncology

## 2021-06-19 ENCOUNTER — Ambulatory Visit
Admission: RE | Admit: 2021-06-19 | Discharge: 2021-06-19 | Disposition: A | Discharge status: Home / Self Care | Payer: Medicare Other | Source: Ambulatory Visit | Attending: Nurse Practitioner | Admitting: Nurse Practitioner

## 2021-06-19 DIAGNOSIS — C7951 Secondary malignant neoplasm of bone: Secondary | ICD-10-CM | POA: Insufficient documentation

## 2021-06-19 DIAGNOSIS — I69354 Hemiplegia and hemiparesis following cerebral infarction affecting left non-dominant side: Secondary | ICD-10-CM

## 2021-06-19 DIAGNOSIS — R2689 Other abnormalities of gait and mobility: Secondary | ICD-10-CM

## 2021-06-21 ENCOUNTER — Encounter: Payer: Self-pay | Admitting: Neurology

## 2021-06-21 ENCOUNTER — Other Ambulatory Visit: Payer: Self-pay | Admitting: *Deleted

## 2021-06-21 DIAGNOSIS — Q8500 Neurofibromatosis, unspecified: Secondary | ICD-10-CM

## 2021-06-22 ENCOUNTER — Other Ambulatory Visit: Payer: Self-pay | Admitting: Internal Medicine

## 2021-06-22 DIAGNOSIS — U071 COVID-19: Secondary | ICD-10-CM

## 2021-06-22 HISTORY — DX: COVID-19: U07.1

## 2021-06-22 LAB — SARS CORONAVIRUS 2 (TAT 6-24 HRS): SARS Coronavirus 2: POSITIVE — AB

## 2021-06-23 ENCOUNTER — Inpatient Hospital Stay: Admission: RE | Admit: 2021-06-23 | Payer: Medicare Other | Source: Ambulatory Visit

## 2021-06-23 ENCOUNTER — Telehealth: Payer: Self-pay | Admitting: Pulmonary Disease

## 2021-06-23 ENCOUNTER — Ambulatory Visit (INDEPENDENT_AMBULATORY_CARE_PROVIDER_SITE_OTHER): Payer: Medicare Other | Admitting: Primary Care

## 2021-06-23 ENCOUNTER — Encounter: Payer: Self-pay | Admitting: Primary Care

## 2021-06-23 ENCOUNTER — Telehealth: Payer: Self-pay | Admitting: Primary Care

## 2021-06-23 ENCOUNTER — Other Ambulatory Visit: Payer: Self-pay

## 2021-06-23 DIAGNOSIS — I10 Essential (primary) hypertension: Secondary | ICD-10-CM | POA: Insufficient documentation

## 2021-06-23 DIAGNOSIS — I639 Cerebral infarction, unspecified: Secondary | ICD-10-CM | POA: Insufficient documentation

## 2021-06-23 DIAGNOSIS — R918 Other nonspecific abnormal finding of lung field: Secondary | ICD-10-CM | POA: Diagnosis not present

## 2021-06-23 DIAGNOSIS — U071 COVID-19: Secondary | ICD-10-CM | POA: Diagnosis not present

## 2021-06-23 HISTORY — DX: Cerebral infarction, unspecified: I63.9

## 2021-06-23 MED ORDER — MOLNUPIRAVIR EUA 200MG CAPSULE: 4.0000 | ORAL_CAPSULE | Freq: Two times a day (BID) | ORAL | 0 refills | Status: AC

## 2021-06-23 MED ORDER — MOLNUPIRAVIR EUA 200MG CAPSULE: 4.0000 | ORAL_CAPSULE | Freq: Two times a day (BID) | ORAL | 0 refills | Status: DC

## 2021-06-23 NOTE — Progress Notes (Signed)
Dr. Valeta Harms made aware of the pt's positive covid result from [facility] 06/22/21.

## 2021-06-23 NOTE — Telephone Encounter (Signed)
I called and spoke with hailey at my pharmacy who stated they do not have Molnupiravir in stock but they do have paxlovid. Is wanting to know if that is ok. Will route to Dr. Valeta Harms.  Dr. Valeta Harms, please advise. Thanks!

## 2021-06-23 NOTE — Telephone Encounter (Signed)
Lynn Wiley called me from [place] CT because pt was there to get Super D when she got the call that she was covid positive.  She left & CT was not done.  [place] is scheduled for 8/5.

## 2021-06-23 NOTE — Telephone Encounter (Signed)
Called spoke with pt's daughter to see if there was a different pharmacy that we could send the molnupiravir to in place of [facility] and she said that we could send it to [facility] off of [address]. Rx has been sent to [facility] for pt.   Called [facility] to see if they would be able to get the Rx ready for pt and they said that they did have it in stock.   Called [facility] and spoke with Hailey letting her know that she could d/c the Rx as we had sent it to another pharmacy for pt as they had it in stock. Hailey verbalized understanding. Nothing further needed.

## 2021-06-23 NOTE — Progress Notes (Signed)
Thank you for seeing her today.  I have rescheduled for the [facility] 06/23/2021 12:16 PM

## 2021-06-23 NOTE — Telephone Encounter (Signed)
I called and spoke with daughter Wyvonnia Dusky who stated that patient went for covid test today for bronch on 06/25/21 and patient was positive but patient is asymptomatic. They did not get Super D CT done as they were told to leave. Daughter is wondering what to do about bronch on Friday. Will route to Dr. Valeta Harms for recs.  Dr. Valeta Harms, please advise on recs. Thanks!

## 2021-06-23 NOTE — Progress Notes (Signed)
Virtual Visit via Telephone Note  I connected with Lynn Wiley on 06/23/21 at 11:30 AM EDT by telephone and verified that I am speaking with the correct person using two identifiers.  Location: Patient: Home Provider: Office   I discussed the limitations, risks, security and privacy concerns of performing an evaluation and management service by telephone and the availability of in person appointments. I also discussed with the patient that there may be a patient responsible charge related to this service. The patient expressed understanding and agreed to proceed.   History of Present Illness: 65 year old female, former smoker quit in 2017. PMH significant for neurofibromatosis, left upper lobe lung mass, bone mets, stoke, high cholesterol, HTN. Patient of Dr. Valeta Harms, seen for initial consult on 06/16/21 for lung mass.   06/16/21- Dr. Valeta Harms, Consult  Chief Complaint  Patient presents with   Consult    Reports weight loss and pain in her shoulder that led to the abnormal lung imaging. She originally had imaging for her neck pain and saw something abnormal in her lung. She has a cough for years. Smoked 1/2 pack/day  for 42 years.    This is a 65 year old female, past medical history of hypertension, neurofibromatosis.  Patient has had a ongoing weight loss, shoulder pain, found to have weakness in her right arm.  She has smoked a half a pack a day for 42+ years.  Sent for CT scan imaging.  Found to have a large T1 thoracic lesion compressing nervous structure affecting her right arm.  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  Patient was referred here for discuss bronchoscopic evaluation for tissue diagnosis and biopsy.  Patient has already been seen in establish care with medical oncology Dr. Earlie Server.   06/23/2021- interim hx  Patient was contacted today for televisit due to recent positive covid results. She had pre-op testing on  06/22/21 for a bronchoscopy scheduled on Friday August 5th with Dr. Valeta Harms and tested positive for Covid-19. She denies active covid symptoms but does have chronic cough with sputum production and shortness of breath. She has not been sick recently and has no known contacts. She received one dose J&J vaccine on 10/09/2020, she has not been boosted. Denies fever, chest tightness, cough, shortness of breath, nasal congestion, sore throat, N/V/D. She will need Super-D CT CHEST and EBUS rescheduled   Observations/Objective:  - Able to speak in full sentences without shortness of breath, cough, wheezing  Assessment and Plan:  Mild Covid-19 infection: - Tested positive on pre-op testing 06/22/21; She received J&J vaccine in November 2021 but has not received booster. She is currently asymptomatic. She is at high risk for worsening/progressive covid symptoms d.t her age and underlying lung mass.There are interactions listed with some of her home medication (CCB and statin) and Paxlovid. We will send in RX for Molnupiravir. She will notify our office if she develop any worsening symptom, advised to present to ED if develops acute shortness of breath   Lung mass - MRI and Super-D CT have been rescheduled for August 12th - EBUS will need to be rescheduled d/t covid infection   Follow Up Instructions:  - As needed if she develops worsening covid symptoms    I discussed the assessment and treatment plan with the patient. The patient was provided an opportunity to ask questions and all were answered. The patient agreed with the plan and demonstrated an understanding of the instructions.  The patient was advised to call back or seek an in-person evaluation if the symptoms worsen or if the condition fails to improve as anticipated.  I provided 28 minutes of non-face-to-face time during this encounter.   Martyn Ehrich, NP

## 2021-06-23 NOTE — Telephone Encounter (Signed)
Will forward to [facility] to advise further.

## 2021-06-23 NOTE — Telephone Encounter (Signed)
Agree with Dr. Valeta Harms, please ask patient to call another pharmacy location near her to see if they have molnupiravir in stock. I will cc our pharmacy team as well to see if they are able to check inventory in the area before we send in new RX. If not available we may be able to due paxlovid I would just need to decreased her Norvasc dose and hold statin   Cc: pharmacy

## 2021-06-23 NOTE — Patient Instructions (Addendum)
There are interactions listed with some of her home medication (CCB and statin) and Paxlovid. We will send in RX for Molnupiravir. She will notify our office if she develop any worsening symptom, advised to present to ED if develops acute shortness of breath   - MRI and Super-D CT have been rescheduled for August 12th - EBUS will need to be rescheduled d/t covid infection

## 2021-06-23 NOTE — Telephone Encounter (Signed)
CT has been r/s to 8/12 & bronch to 8/16 @7 :30 AM.  Pt has been made aware of both appt.  Per BI's request, sched televisit w/ B. Volanda Napoleon today @ 11:30 for possible oral medications for positive covid treatment.  Nothing further needed at this time.

## 2021-06-23 NOTE — Telephone Encounter (Signed)
Called and spoke with patient's daughter. She verbalized understanding. I advised her that someone from our office will call get the CT rescheduled as well as the EBUS.   Will forward to the PCCs.

## 2021-06-25 DIAGNOSIS — R918 Other nonspecific abnormal finding of lung field: Secondary | ICD-10-CM | POA: Insufficient documentation

## 2021-06-28 ENCOUNTER — Ambulatory Visit: Payer: Medicare Other | Admitting: Radiation Oncology

## 2021-06-28 ENCOUNTER — Other Ambulatory Visit: Payer: Self-pay | Admitting: Internal Medicine

## 2021-06-28 DIAGNOSIS — R918 Other nonspecific abnormal finding of lung field: Secondary | ICD-10-CM

## 2021-06-29 ENCOUNTER — Ambulatory Visit: Payer: Medicare Other | Admitting: Radiation Oncology

## 2021-06-29 ENCOUNTER — Inpatient Hospital Stay: Payer: Medicare Other | Attending: Internal Medicine

## 2021-06-30 ENCOUNTER — Ambulatory Visit: Payer: Medicare Other | Admitting: Radiation Oncology

## 2021-06-30 ENCOUNTER — Telehealth: Payer: Self-pay | Admitting: Medical Oncology

## 2021-06-30 ENCOUNTER — Inpatient Hospital Stay: Payer: Medicare Other | Admitting: Internal Medicine

## 2021-06-30 ENCOUNTER — Ambulatory Visit: Payer: Medicare Other

## 2021-06-30 NOTE — Telephone Encounter (Signed)
COVID + result on 06/22/21.  Appt cancelled today . Schedule message sent for lab and est f/u

## 2021-07-01 ENCOUNTER — Ambulatory Visit: Payer: Medicare Other | Admitting: Radiation Oncology

## 2021-07-01 ENCOUNTER — Ambulatory Visit: Payer: Medicare Other

## 2021-07-02 ENCOUNTER — Ambulatory Visit: Payer: Medicare Other | Admitting: Radiation Oncology

## 2021-07-02 ENCOUNTER — Encounter (HOSPITAL_COMMUNITY)
Admission: RE | Admit: 2021-07-02 | Discharge: 2021-07-02 | Disposition: A | Discharge status: Home / Self Care | Payer: Medicare Other | Source: Ambulatory Visit | Attending: Internal Medicine | Admitting: Internal Medicine

## 2021-07-02 ENCOUNTER — Other Ambulatory Visit: Payer: Self-pay

## 2021-07-02 ENCOUNTER — Ambulatory Visit: Payer: Medicare Other

## 2021-07-02 ENCOUNTER — Ambulatory Visit (INDEPENDENT_AMBULATORY_CARE_PROVIDER_SITE_OTHER)
Admission: RE | Admit: 2021-07-02 | Discharge: 2021-07-02 | Disposition: A | Discharge status: Home / Self Care | Payer: Medicare Other | Source: Ambulatory Visit | Attending: Pulmonary Disease | Admitting: Pulmonary Disease

## 2021-07-02 DIAGNOSIS — R918 Other nonspecific abnormal finding of lung field: Secondary | ICD-10-CM | POA: Diagnosis not present

## 2021-07-02 DIAGNOSIS — C3490 Malignant neoplasm of unspecified part of unspecified bronchus or lung: Secondary | ICD-10-CM | POA: Insufficient documentation

## 2021-07-02 MED ORDER — GADOBUTROL 1 MMOL/ML IV SOLN
5.0000 mL | Freq: Once | INTRAVENOUS | Status: AC | PRN
  Administered 2021-07-02: 5 mL via INTRAVENOUS

## 2021-07-05 ENCOUNTER — Other Ambulatory Visit: Payer: Self-pay

## 2021-07-05 ENCOUNTER — Ambulatory Visit: Payer: Medicare Other

## 2021-07-05 ENCOUNTER — Encounter (HOSPITAL_COMMUNITY): Payer: Self-pay | Admitting: Pulmonary Disease

## 2021-07-05 ENCOUNTER — Ambulatory Visit: Payer: Medicare Other | Admitting: Radiation Oncology

## 2021-07-05 NOTE — Progress Notes (Signed)
Spoke with Lynn Wiley, pt's daughter for pre-op call. DPR on file. She states pt does not have a cardiac history is not diabetic. Pt has had a stroke in 2016 but no lasting weakness from it. She is experiencing right sided weakness due to a mass in her lung that is compressing the nerve in right arm.   Pt tested positive for Covid on 06/22/21. Lynn Wiley states pt had no symptoms. No Covid test needed for this procedure.

## 2021-07-06 ENCOUNTER — Ambulatory Visit (HOSPITAL_COMMUNITY): Payer: Medicare Other

## 2021-07-06 ENCOUNTER — Ambulatory Visit (HOSPITAL_COMMUNITY): Payer: Medicare Other | Admitting: Anesthesiology

## 2021-07-06 ENCOUNTER — Ambulatory Visit: Payer: Medicare Other

## 2021-07-06 ENCOUNTER — Encounter (HOSPITAL_COMMUNITY)
Admission: RE | Disposition: A | Discharge status: Home / Self Care | Payer: Self-pay | Source: Home / Self Care | Attending: Pulmonary Disease

## 2021-07-06 ENCOUNTER — Ambulatory Visit: Payer: Medicare Other | Admitting: Radiation Oncology

## 2021-07-06 ENCOUNTER — Ambulatory Visit (HOSPITAL_COMMUNITY)
Admission: RE | Admit: 2021-07-06 | Discharge: 2021-07-06 | Disposition: A | Discharge status: Home / Self Care | Payer: Medicare Other | Attending: Pulmonary Disease | Admitting: Pulmonary Disease

## 2021-07-06 ENCOUNTER — Encounter (HOSPITAL_COMMUNITY): Payer: Self-pay | Admitting: Pulmonary Disease

## 2021-07-06 ENCOUNTER — Ambulatory Visit: Payer: Medicare Other | Admitting: Primary Care

## 2021-07-06 DIAGNOSIS — Z419 Encounter for procedure for purposes other than remedying health state, unspecified: Secondary | ICD-10-CM

## 2021-07-06 DIAGNOSIS — R918 Other nonspecific abnormal finding of lung field: Secondary | ICD-10-CM

## 2021-07-06 DIAGNOSIS — Z8673 Personal history of transient ischemic attack (TIA), and cerebral infarction without residual deficits: Secondary | ICD-10-CM | POA: Diagnosis not present

## 2021-07-06 DIAGNOSIS — Z9889 Other specified postprocedural states: Secondary | ICD-10-CM

## 2021-07-06 DIAGNOSIS — Z7982 Long term (current) use of aspirin: Secondary | ICD-10-CM | POA: Insufficient documentation

## 2021-07-06 DIAGNOSIS — I10 Essential (primary) hypertension: Secondary | ICD-10-CM | POA: Diagnosis not present

## 2021-07-06 DIAGNOSIS — C7951 Secondary malignant neoplasm of bone: Secondary | ICD-10-CM | POA: Diagnosis not present

## 2021-07-06 DIAGNOSIS — Z87891 Personal history of nicotine dependence: Secondary | ICD-10-CM | POA: Diagnosis not present

## 2021-07-06 DIAGNOSIS — I509 Heart failure, unspecified: Secondary | ICD-10-CM

## 2021-07-06 DIAGNOSIS — Z79899 Other long term (current) drug therapy: Secondary | ICD-10-CM | POA: Diagnosis not present

## 2021-07-06 DIAGNOSIS — Q8500 Neurofibromatosis, unspecified: Secondary | ICD-10-CM | POA: Insufficient documentation

## 2021-07-06 DIAGNOSIS — C3412 Malignant neoplasm of upper lobe, left bronchus or lung: Secondary | ICD-10-CM | POA: Insufficient documentation

## 2021-07-06 HISTORY — PX: BRONCHIAL NEEDLE ASPIRATION BIOPSY: SHX5106

## 2021-07-06 HISTORY — PX: VIDEO BRONCHOSCOPY WITH RADIAL ENDOBRONCHIAL ULTRASOUND: SHX6849

## 2021-07-06 HISTORY — PX: VIDEO BRONCHOSCOPY WITH ENDOBRONCHIAL NAVIGATION: SHX6175

## 2021-07-06 HISTORY — PX: BRONCHIAL BRUSHINGS: SHX5108

## 2021-07-06 HISTORY — PX: BRONCHIAL BIOPSY: SHX5109

## 2021-07-06 LAB — CBC
HCT: 41.6 % (ref 36.0–46.0)
Hemoglobin: 13.3 g/dL (ref 12.0–15.0)
MCH: 29.7 pg (ref 26.0–34.0)
MCHC: 32 g/dL (ref 30.0–36.0)
MCV: 92.9 fL (ref 80.0–100.0)
Platelets: 151 10*3/uL (ref 150–400)
RBC: 4.48 MIL/uL (ref 3.87–5.11)
RDW: 14.6 % (ref 11.5–15.5)
WBC: 15.6 10*3/uL — ABNORMAL HIGH (ref 4.0–10.5)
nRBC: 0 % (ref 0.0–0.2)

## 2021-07-06 LAB — BASIC METABOLIC PANEL
Anion gap: 8 (ref 5–15)
BUN: 31 mg/dL — ABNORMAL HIGH (ref 8–23)
CO2: 26 mmol/L (ref 22–32)
Calcium: 8.2 mg/dL — ABNORMAL LOW (ref 8.9–10.3)
Chloride: 109 mmol/L (ref 98–111)
Creatinine, Ser: 0.7 mg/dL (ref 0.44–1.00)
GFR, Estimated: >60 mL/min (ref >60)
Glucose, Bld: 220 mg/dL — ABNORMAL HIGH (ref 70–99)
Potassium: 5.1 mmol/L (ref 3.5–5.1)
Sodium: 143 mmol/L (ref 135–145)

## 2021-07-06 SURGERY — VIDEO BRONCHOSCOPY WITH ENDOBRONCHIAL NAVIGATION
Anesthesia: General | Laterality: Left

## 2021-07-06 MED ORDER — ROCURONIUM BROMIDE 10 MG/ML (PF) SYRINGE
PREFILLED_SYRINGE | INTRAVENOUS | Status: DC | PRN
  Administered 2021-07-06: 50 mg via INTRAVENOUS

## 2021-07-06 MED ORDER — MIDAZOLAM HCL 2 MG/2ML IJ SOLN
INTRAMUSCULAR | Status: DC | PRN
  Administered 2021-07-06: 2 mg via INTRAVENOUS

## 2021-07-06 MED ORDER — LIDOCAINE 2% (20 MG/ML) 5 ML SYRINGE
INTRAMUSCULAR | Status: DC | PRN
  Administered 2021-07-06: 100 mg via INTRAVENOUS

## 2021-07-06 MED ORDER — LACTATED RINGERS IV SOLN: INTRAVENOUS | Status: DC

## 2021-07-06 MED ORDER — PROPOFOL 10 MG/ML IV BOLUS
INTRAVENOUS | Status: DC | PRN
  Administered 2021-07-06: 140 mg via INTRAVENOUS

## 2021-07-06 MED ORDER — EPHEDRINE SULFATE-NACL 50-0.9 MG/10ML-% IV SOSY
PREFILLED_SYRINGE | INTRAVENOUS | Status: DC | PRN
  Administered 2021-07-06 (×2): 10 mg via INTRAVENOUS

## 2021-07-06 MED ORDER — SUGAMMADEX SODIUM 200 MG/2ML IV SOLN
INTRAVENOUS | Status: DC | PRN
  Administered 2021-07-06: 110 mg via INTRAVENOUS

## 2021-07-06 MED ORDER — DEXAMETHASONE SODIUM PHOSPHATE 10 MG/ML IJ SOLN
INTRAMUSCULAR | Status: DC | PRN
  Administered 2021-07-06: 10 mg via INTRAVENOUS

## 2021-07-06 MED ORDER — CHLORHEXIDINE GLUCONATE 0.12 % MT SOLN
OROMUCOSAL | Status: AC
  Administered 2021-07-06: 15 mL
  Filled 2021-07-06: qty 15

## 2021-07-06 MED ORDER — FENTANYL CITRATE (PF) 250 MCG/5ML IJ SOLN
INTRAMUSCULAR | Status: DC | PRN
  Administered 2021-07-06 (×2): 50 ug via INTRAVENOUS

## 2021-07-06 MED ORDER — ONDANSETRON HCL 4 MG/2ML IJ SOLN
INTRAMUSCULAR | Status: DC | PRN
  Administered 2021-07-06: 4 mg via INTRAVENOUS

## 2021-07-06 SURGICAL SUPPLY — 50 items
ADAPTER BRONCH F/PENTAX (ADAPTER) ×4 IMPLANT
ADAPTER VALVE BIOPSY EBUS (MISCELLANEOUS) IMPLANT
ADPTR VALVE BIOPSY EBUS (MISCELLANEOUS)
BRUSH CYTOL CELLEBRITY 1.5X140 (MISCELLANEOUS) ×4 IMPLANT
BRUSH SUPERTRAX BIOPSY (INSTRUMENTS) IMPLANT
BRUSH SUPERTRAX NDL-TIP CYTO (INSTRUMENTS) ×4 IMPLANT
CANISTER SUCT 3000ML PPV (MISCELLANEOUS) ×4 IMPLANT
CHANNEL WORK EXTEND EDGE 180 (KITS) IMPLANT
CHANNEL WORK EXTEND EDGE 45 (KITS) IMPLANT
CHANNEL WORK EXTEND EDGE 90 (KITS) IMPLANT
CONT SPEC 4OZ CLIKSEAL STRL BL (MISCELLANEOUS) ×4 IMPLANT
COVER BACK TABLE 60X90IN (DRAPES) ×4 IMPLANT
COVER DOME SNAP 22 D (MISCELLANEOUS) ×4 IMPLANT
FILTER STRAW FLUID ASPIR (MISCELLANEOUS) IMPLANT
FORCEPS BIOP RJ4 1.8 (CUTTING FORCEPS) IMPLANT
FORCEPS BIOP SUPERTRX PREMAR (INSTRUMENTS) ×4 IMPLANT
GAUZE SPONGE 4X4 12PLY STRL (GAUZE/BANDAGES/DRESSINGS) ×4 IMPLANT
GLOVE BIO SURGEON STRL SZ7.5 (GLOVE) ×4 IMPLANT
GLOVE SURG SS PI 7.5 STRL IVOR (GLOVE) ×8 IMPLANT
GOWN STRL REUS W/ TWL LRG LVL3 (GOWN DISPOSABLE) ×6 IMPLANT
GOWN STRL REUS W/TWL LRG LVL3 (GOWN DISPOSABLE) ×2
KIT CLEAN ENDO COMPLIANCE (KITS) ×8 IMPLANT
KIT LOCATABLE GUIDE (CANNULA) IMPLANT
KIT MARKER FIDUCIAL DELIVERY (KITS) IMPLANT
KIT PROCEDURE EDGE 180 (KITS) IMPLANT
KIT PROCEDURE EDGE 45 (KITS) IMPLANT
KIT PROCEDURE EDGE 90 (KITS) IMPLANT
KIT TURNOVER KIT B (KITS) ×4 IMPLANT
MARKER SKIN DUAL TIP RULER LAB (MISCELLANEOUS) ×4 IMPLANT
NEEDLE EBUS SONO TIP PENTAX (NEEDLE) ×4 IMPLANT
NEEDLE SUPERTRX PREMARK BIOPSY (NEEDLE) ×4 IMPLANT
NS IRRIG 1000ML POUR BTL (IV SOLUTION) ×4 IMPLANT
OIL SILICONE PENTAX (PARTS (SERVICE/REPAIRS)) ×4 IMPLANT
PAD ARMBOARD 7.5X6 YLW CONV (MISCELLANEOUS) ×8 IMPLANT
PATCHES PATIENT (LABEL) ×12 IMPLANT
SOL ANTI FOG 6CC (MISCELLANEOUS) ×3 IMPLANT
SOLUTION ANTI FOG 6CC (MISCELLANEOUS) ×1
SYR 20CC LL (SYRINGE) ×8 IMPLANT
SYR 20ML ECCENTRIC (SYRINGE) ×8 IMPLANT
SYR 50ML SLIP (SYRINGE) ×4 IMPLANT
SYR 5ML LUER SLIP (SYRINGE) ×4 IMPLANT
TOWEL OR 17X24 6PK STRL BLUE (TOWEL DISPOSABLE) ×4 IMPLANT
TRAP SPECIMEN MUCOUS 40CC (MISCELLANEOUS) IMPLANT
TUBE CONNECTING 20X1/4 (TUBING) ×8 IMPLANT
UNDERPAD 30X30 (UNDERPADS AND DIAPERS) ×4 IMPLANT
VALVE BIOPSY  SINGLE USE (MISCELLANEOUS) ×1
VALVE BIOPSY SINGLE USE (MISCELLANEOUS) ×3 IMPLANT
VALVE DISPOSABLE (MISCELLANEOUS) ×4 IMPLANT
VALVE SUCTION BRONCHIO DISP (MISCELLANEOUS) ×4 IMPLANT
WATER STERILE IRR 1000ML POUR (IV SOLUTION) ×4 IMPLANT

## 2021-07-06 NOTE — Anesthesia Preprocedure Evaluation (Signed)
Anesthesia Evaluation  Patient identified by MRN, date of birth, ID band Patient awake    Reviewed: Allergy & Precautions, NPO status , Patient's Chart, lab work & pertinent test results  Airway Mallampati: II  TM Distance: >3 FB Neck ROM: Full    Dental no notable dental hx.    Pulmonary neg pulmonary ROS, former smoker,    Pulmonary exam normal breath sounds clear to auscultation       Cardiovascular hypertension, Pt. on medications Normal cardiovascular exam Rhythm:Regular Rate:Normal     Neuro/Psych CVA negative psych ROS   GI/Hepatic negative GI ROS, Neg liver ROS,   Endo/Other  negative endocrine ROS  Renal/GU negative Renal ROS  negative genitourinary   Musculoskeletal negative musculoskeletal ROS (+)   Abdominal   Peds negative pediatric ROS (+)  Hematology negative hematology ROS (+)   Anesthesia Other Findings   Reproductive/Obstetrics negative OB ROS                             Anesthesia Physical Anesthesia Plan  ASA: 3  Anesthesia Plan: General   Post-op Pain Management:    Induction: Intravenous  PONV Risk Score and Plan: 3 and Ondansetron, Dexamethasone and Treatment may vary due to age or medical condition  Airway Management Planned: Oral ETT  Additional Equipment:   Intra-op Plan:   Post-operative Plan: Extubation in OR  Informed Consent: I have reviewed the patients History and Physical, chart, labs and discussed the procedure including the risks, benefits and alternatives for the proposed anesthesia with the patient or authorized representative who has indicated his/her understanding and acceptance.     Dental advisory given  Plan Discussed with: CRNA and Surgeon  Anesthesia Plan Comments:         Anesthesia Quick Evaluation

## 2021-07-06 NOTE — Anesthesia Postprocedure Evaluation (Signed)
Anesthesia Post Note  Patient: Lynn Wiley  Procedure(s) Performed: VIDEO BRONCHOSCOPY WITH ENDOBRONCHIAL NAVIGATION (Left) VIDEO BRONCHOSCOPY WITH RADIAL ENDOBRONCHIAL ULTRASOUND BRONCHIAL BRUSHINGS BRONCHIAL BIOPSIES BRONCHIAL NEEDLE ASPIRATION BIOPSIES     Patient location during evaluation: PACU Anesthesia Type: General Level of consciousness: awake and alert Pain management: pain level controlled Vital Signs Assessment: post-procedure vital signs reviewed and stable Respiratory status: spontaneous breathing, nonlabored ventilation, respiratory function stable and patient connected to nasal cannula oxygen Cardiovascular status: blood pressure returned to baseline and stable Postop Assessment: no apparent nausea or vomiting Anesthetic complications: no   No notable events documented.  Last Vitals:  Vitals:   07/06/21 0859 07/06/21 0914  BP: (!) 170/90 (!) 152/98  Pulse: 62 66  Resp: 15 14  Temp:  36.6 C  SpO2: 94% 96%    Last Pain:  Vitals:   07/06/21 0914  TempSrc:   PainSc: 0-No pain                 Cedric Denison S

## 2021-07-06 NOTE — Op Note (Signed)
Video Bronchoscopy with Robotic Assisted Bronchoscopic Navigation   Date of Operation: 07/06/2021   Pre-op Diagnosis: Lung mass  Post-op Diagnosis: Lung mass  Surgeon: Garner Nash, DO  Assistants: None   Anesthesia: General endotracheal anesthesia  Operation: Flexible video fiberoptic bronchoscopy with robotic assistance and biopsies.  Estimated Blood Loss: Minimal  Complications: None  Indications and History: Lynn Wiley is a 65 y.o. female with history of lung mass, right arm weakness. The risks, benefits, complications, treatment options and expected outcomes were discussed with the patient.  The possibilities of pneumothorax, pneumonia, reaction to medication, pulmonary aspiration, perforation of a viscus, bleeding, failure to diagnose a condition and creating a complication requiring transfusion or operation were discussed with the patient who freely signed the consent.    Description of Procedure: The patient was seen in the Preoperative Area, was examined and was deemed appropriate to proceed.  The patient was taken to [facility] endoscopy room 3, identified as Lynn Wiley and the procedure verified as Flexible Video Fiberoptic Bronchoscopy.  A Time Out was held and the above information confirmed.   Prior to the date of the procedure a high-resolution CT scan of the chest was performed. Utilizing ION software program a virtual tracheobronchial tree was generated to allow the creation of distinct navigation pathways to the patient's parenchymal abnormalities. After being taken to the operating room general anesthesia was initiated and the patient  was orally intubated. The video fiberoptic bronchoscope was introduced via the endotracheal tube and a general inspection was performed which showed normal right and left lung anatomy, aspiration of the bilateral mainstems was completed to remove any remaining secretions. Robotic catheter inserted into patient's endotracheal tube.   Target #1  left upper lobe: The distinct navigation pathways prepared prior to this procedure were then utilized to navigate to patient's lesion identified on CT scan. The robotic catheter was secured into place and the vision probe was withdrawn.  Lesion location was approximated using fluoroscopy and radial endobronchial ultrasound for peripheral targeting. Under fluoroscopic guidance transbronchial needle brushings, transbronchial needle biopsies, and transbronchial forceps biopsies were performed to be sent for cytology and pathology.  At the end of the procedure a general airway inspection was performed and there was no evidence of active bleeding. The bronchoscope was removed.  The patient tolerated the procedure well. There was no significant blood loss and there were no obvious complications. A post-procedural chest x-ray is pending.  Samples Target #1: 1. Transbronchial needle brushings from left upper lobe 2. Transbronchial Wang needle biopsies from left upper lobe 3. Transbronchial forceps biopsies from left upper lobe  Plans:  The patient will be discharged from the PACU to home when recovered from anesthesia and after chest x-ray is reviewed. We will review the cytology, pathology and microbiology results with the patient when they become available. Outpatient followup will be with Garner Nash, DO.  Garner Nash, DO [facility] 07/06/2021 8:41 AM

## 2021-07-06 NOTE — Anesthesia Procedure Notes (Signed)
Procedure Name: Intubation Date/Time: 07/06/2021 7:38 AM Performed by: Lance Coon, CRNA Pre-anesthesia Checklist: Patient identified, Emergency Drugs available, Suction available, Patient being monitored and Timeout performed Patient Re-evaluated:Patient Re-evaluated prior to induction Oxygen Delivery Method: Circle system utilized Preoxygenation: Pre-oxygenation with 100% oxygen Induction Type: IV induction Ventilation: Mask ventilation without difficulty Laryngoscope Size: Miller and 3 Grade View: Grade I Tube type: Oral Tube size: 8.5 mm Number of attempts: 1 Airway Equipment and Method: Stylet Placement Confirmation: ETT inserted through vocal cords under direct vision, positive ETCO2 and breath sounds checked- equal and bilateral Secured at: 21 cm Tube secured with: Tape Dental Injury: Teeth and Oropharynx as per pre-operative assessment

## 2021-07-06 NOTE — Discharge Instructions (Signed)
Flexible Bronchoscopy, Care After This sheet gives you information about how to care for yourself after your test. Your doctor may also give you more specific instructions. If you have problems or questions, contact your doctor. Follow these instructions at home: Eating and drinking Do not eat or drink anything (not even water) for 2 hours after your test, or until your numbing medicine (local anesthetic) wears off. When your numbness is gone and your cough and gag reflexes have come back, you may: Eat only soft foods. Slowly drink liquids. The day after the test, go back to your normal diet. Driving Do not drive for 24 hours if you were given a medicine to help you relax (sedative). Do not drive or use heavy machinery while taking prescription pain medicine. General instructions  Take over-the-counter and prescription medicines only as told by your doctor. Return to your normal activities as told. Ask what activities are safe for you. Do not use any products that have nicotine or tobacco in them. This includes cigarettes and e-cigarettes. If you need help quitting, ask your doctor. Keep all follow-up visits as told by your doctor. This is important. It is very important if you had a tissue sample (biopsy) taken. Get help right away if: You have shortness of breath that gets worse. You get light-headed. You feel like you are going to pass out (faint). You have chest pain. You cough up: More than a little blood. More blood than before. Summary Do not eat or drink anything (not even water) for 2 hours after your test, or until your numbing medicine wears off. Do not use cigarettes. Do not use e-cigarettes. Get help right away if you have chest pain.  This information is not intended to replace advice given to you by your health care provider. Make sure you discuss any questions you have with your health care provider. Document Released: 09/04/2009 Document Revised: 10/20/2017 Document  Reviewed: 11/25/2016 Elsevier Patient Education  2020 [employer].

## 2021-07-06 NOTE — Interval H&P Note (Signed)
History and Physical Interval Note:  07/06/2021 7:21 AM  Lynn Wiley  has presented today for surgery, with the diagnosis of lung mass.  The various methods of treatment have been discussed with the patient and family. After consideration of risks, benefits and other options for treatment, the patient has consented to  Procedure(s) with comments: [place] (Left) - ION VIDEO BRONCHOSCOPY WITH ENDOBRONCHIAL ULTRASOUND (Bilateral) as a surgical intervention.  The patient's history has been reviewed, patient examined, no change in status, stable for surgery.  I have reviewed the patient's chart and labs.  Questions were answered to the patient's satisfaction.     [place]

## 2021-07-06 NOTE — Transfer of Care (Signed)
Immediate Anesthesia Transfer of Care Note  Patient: Lynn Wiley  Procedure(s) Performed: VIDEO BRONCHOSCOPY WITH ENDOBRONCHIAL NAVIGATION (Left) VIDEO BRONCHOSCOPY WITH RADIAL ENDOBRONCHIAL ULTRASOUND BRONCHIAL BRUSHINGS BRONCHIAL BIOPSIES BRONCHIAL NEEDLE ASPIRATION BIOPSIES  Patient Location: PACU  Anesthesia Type:General  Level of Consciousness: drowsy and patient cooperative  Airway & Oxygen Therapy: Patient Spontanous Breathing  Post-op Assessment: Report given to RN and Post -op Vital signs reviewed and stable  Post vital signs: Reviewed and stable  Last Vitals:  Vitals Value Taken Time  BP    Temp    Pulse    Resp    SpO2      Last Pain:  Vitals:   07/06/21 0609  TempSrc:   PainSc: 0-No pain         Complications: No notable events documented.

## 2021-07-07 ENCOUNTER — Encounter (HOSPITAL_COMMUNITY)
Admission: RE | Admit: 2021-07-07 | Discharge: 2021-07-07 | Disposition: A | Discharge status: Home / Self Care | Payer: Medicare Other | Source: Ambulatory Visit | Attending: Internal Medicine | Admitting: Internal Medicine

## 2021-07-07 ENCOUNTER — Other Ambulatory Visit: Payer: Self-pay

## 2021-07-07 ENCOUNTER — Ambulatory Visit: Payer: Medicare Other

## 2021-07-07 ENCOUNTER — Encounter (HOSPITAL_COMMUNITY): Payer: Self-pay | Admitting: Pulmonary Disease

## 2021-07-07 ENCOUNTER — Ambulatory Visit: Payer: Medicare Other | Admitting: Radiation Oncology

## 2021-07-07 DIAGNOSIS — J439 Emphysema, unspecified: Secondary | ICD-10-CM | POA: Diagnosis not present

## 2021-07-07 DIAGNOSIS — I700 Atherosclerosis of aorta: Secondary | ICD-10-CM | POA: Diagnosis not present

## 2021-07-07 DIAGNOSIS — C3490 Malignant neoplasm of unspecified part of unspecified bronchus or lung: Secondary | ICD-10-CM | POA: Diagnosis present

## 2021-07-07 DIAGNOSIS — C7951 Secondary malignant neoplasm of bone: Secondary | ICD-10-CM | POA: Diagnosis not present

## 2021-07-07 DIAGNOSIS — C3412 Malignant neoplasm of upper lobe, left bronchus or lung: Secondary | ICD-10-CM | POA: Diagnosis not present

## 2021-07-07 LAB — GLUCOSE, CAPILLARY: Glucose-Capillary: 170 mg/dL — ABNORMAL HIGH (ref 70–99)

## 2021-07-07 MED ORDER — FLUDEOXYGLUCOSE F - 18 (FDG) INJECTION
5.6000 | Freq: Once | INTRAVENOUS | Status: AC | PRN
  Administered 2021-07-07: 5.64 via INTRAVENOUS

## 2021-07-08 ENCOUNTER — Telehealth: Payer: Self-pay | Admitting: Pulmonary Disease

## 2021-07-08 ENCOUNTER — Ambulatory Visit: Payer: Medicare Other | Admitting: Radiation Oncology

## 2021-07-08 ENCOUNTER — Ambulatory Visit: Payer: Medicare Other

## 2021-07-08 LAB — CYTOLOGY - NON PAP

## 2021-07-08 NOTE — Telephone Encounter (Signed)
PCCM:  I tried calling patient with final path results. We will try to reach later.   She has follow up with oncology scheduled.   + Small cell carcinoma   Garner Nash, DO [facility] 07/08/2021 4:57 PM

## 2021-07-09 ENCOUNTER — Encounter: Payer: Self-pay | Admitting: *Deleted

## 2021-07-09 ENCOUNTER — Telehealth: Payer: Self-pay | Admitting: Internal Medicine

## 2021-07-09 ENCOUNTER — Ambulatory Visit: Payer: Medicare Other

## 2021-07-09 ENCOUNTER — Ambulatory Visit: Payer: Medicare Other | Admitting: Radiation Oncology

## 2021-07-09 NOTE — Progress Notes (Signed)
I received a message patient has resulted bx information.  I notified scheduling to call her and for her to be seen on 8/24 with Dr. Julien Nordmann and labs.

## 2021-07-09 NOTE — Telephone Encounter (Signed)
Call made to patient daughter(on dpr), made her aware BI has been trying to reach them to discuss path results. She states they will be together this afternoon after 1. Made aware I would let BI know.

## 2021-07-09 NOTE — Telephone Encounter (Signed)
Scheduled appts per 8/19 sch msg. Pt's daughter is aware.

## 2021-07-12 ENCOUNTER — Ambulatory Visit: Payer: Medicare Other | Admitting: Radiation Oncology

## 2021-07-12 ENCOUNTER — Encounter (HOSPITAL_COMMUNITY): Payer: Self-pay | Admitting: Emergency Medicine

## 2021-07-12 ENCOUNTER — Emergency Department (HOSPITAL_COMMUNITY): Payer: Medicare Other

## 2021-07-12 ENCOUNTER — Other Ambulatory Visit: Payer: Self-pay

## 2021-07-12 ENCOUNTER — Telehealth: Payer: Self-pay

## 2021-07-12 ENCOUNTER — Ambulatory Visit: Payer: Medicare Other

## 2021-07-12 ENCOUNTER — Inpatient Hospital Stay (HOSPITAL_COMMUNITY)
Admission: EM | Admit: 2021-07-12 | Discharge: 2021-07-16 | DRG: 543 | Disposition: A | Discharge status: Home Health | Payer: Medicare Other | Attending: Internal Medicine | Admitting: Internal Medicine

## 2021-07-12 DIAGNOSIS — C7931 Secondary malignant neoplasm of brain: Secondary | ICD-10-CM | POA: Diagnosis present

## 2021-07-12 DIAGNOSIS — Z800 Family history of malignant neoplasm of digestive organs: Secondary | ICD-10-CM

## 2021-07-12 DIAGNOSIS — I10 Essential (primary) hypertension: Secondary | ICD-10-CM | POA: Diagnosis present

## 2021-07-12 DIAGNOSIS — L8990 Pressure ulcer of unspecified site, unspecified stage: Secondary | ICD-10-CM | POA: Insufficient documentation

## 2021-07-12 DIAGNOSIS — Z803 Family history of malignant neoplasm of breast: Secondary | ICD-10-CM | POA: Diagnosis not present

## 2021-07-12 DIAGNOSIS — E785 Hyperlipidemia, unspecified: Secondary | ICD-10-CM | POA: Diagnosis present

## 2021-07-12 DIAGNOSIS — L89312 Pressure ulcer of right buttock, stage 2: Secondary | ICD-10-CM | POA: Diagnosis present

## 2021-07-12 DIAGNOSIS — Z8616 Personal history of COVID-19: Secondary | ICD-10-CM | POA: Diagnosis not present

## 2021-07-12 DIAGNOSIS — R29898 Other symptoms and signs involving the musculoskeletal system: Secondary | ICD-10-CM

## 2021-07-12 DIAGNOSIS — Z87891 Personal history of nicotine dependence: Secondary | ICD-10-CM | POA: Diagnosis not present

## 2021-07-12 DIAGNOSIS — Q8500 Neurofibromatosis, unspecified: Secondary | ICD-10-CM | POA: Diagnosis not present

## 2021-07-12 DIAGNOSIS — Z808 Family history of malignant neoplasm of other organs or systems: Secondary | ICD-10-CM

## 2021-07-12 DIAGNOSIS — R531 Weakness: Secondary | ICD-10-CM

## 2021-07-12 DIAGNOSIS — R7989 Other specified abnormal findings of blood chemistry: Secondary | ICD-10-CM | POA: Diagnosis not present

## 2021-07-12 DIAGNOSIS — C3490 Malignant neoplasm of unspecified part of unspecified bronchus or lung: Secondary | ICD-10-CM | POA: Diagnosis present

## 2021-07-12 DIAGNOSIS — L89322 Pressure ulcer of left buttock, stage 2: Secondary | ICD-10-CM | POA: Diagnosis present

## 2021-07-12 DIAGNOSIS — Z8051 Family history of malignant neoplasm of kidney: Secondary | ICD-10-CM

## 2021-07-12 DIAGNOSIS — I159 Secondary hypertension, unspecified: Secondary | ICD-10-CM

## 2021-07-12 DIAGNOSIS — I69351 Hemiplegia and hemiparesis following cerebral infarction affecting right dominant side: Secondary | ICD-10-CM | POA: Diagnosis not present

## 2021-07-12 DIAGNOSIS — C7951 Secondary malignant neoplasm of bone: Secondary | ICD-10-CM | POA: Diagnosis present

## 2021-07-12 DIAGNOSIS — R739 Hyperglycemia, unspecified: Secondary | ICD-10-CM | POA: Diagnosis present

## 2021-07-12 DIAGNOSIS — T50905A Adverse effect of unspecified drugs, medicaments and biological substances, initial encounter: Secondary | ICD-10-CM | POA: Diagnosis present

## 2021-07-12 DIAGNOSIS — Z8249 Family history of ischemic heart disease and other diseases of the circulatory system: Secondary | ICD-10-CM

## 2021-07-12 DIAGNOSIS — Z801 Family history of malignant neoplasm of trachea, bronchus and lung: Secondary | ICD-10-CM

## 2021-07-12 DIAGNOSIS — Z79899 Other long term (current) drug therapy: Secondary | ICD-10-CM | POA: Diagnosis not present

## 2021-07-12 DIAGNOSIS — Z7982 Long term (current) use of aspirin: Secondary | ICD-10-CM

## 2021-07-12 HISTORY — DX: Malignant (primary) neoplasm, unspecified: C80.1

## 2021-07-12 LAB — CBC
HCT: 38.6 % (ref 36.0–46.0)
Hemoglobin: 12.6 g/dL (ref 12.0–15.0)
MCH: 30.3 pg (ref 26.0–34.0)
MCHC: 32.6 g/dL (ref 30.0–36.0)
MCV: 92.8 fL (ref 80.0–100.0)
Platelets: 127 10*3/uL — ABNORMAL LOW (ref 150–400)
RBC: 4.16 MIL/uL (ref 3.87–5.11)
RDW: 14.7 % (ref 11.5–15.5)
WBC: 12.6 10*3/uL — ABNORMAL HIGH (ref 4.0–10.5)
nRBC: 0 % (ref 0.0–0.2)

## 2021-07-12 LAB — BASIC METABOLIC PANEL
Anion gap: 8 (ref 5–15)
BUN: 33 mg/dL — ABNORMAL HIGH (ref 8–23)
CO2: 25 mmol/L (ref 22–32)
Calcium: 8.2 mg/dL — ABNORMAL LOW (ref 8.9–10.3)
Chloride: 109 mmol/L (ref 98–111)
Creatinine, Ser: 0.61 mg/dL (ref 0.44–1.00)
GFR, Estimated: >60 mL/min (ref >60)
Glucose, Bld: 282 mg/dL — ABNORMAL HIGH (ref 70–99)
Potassium: 4.2 mmol/L (ref 3.5–5.1)
Sodium: 142 mmol/L (ref 135–145)

## 2021-07-12 MED ORDER — ACETAMINOPHEN 325 MG PO TABS
650.0000 mg | ORAL_TABLET | Freq: Four times a day (QID) | ORAL | Status: DC | PRN
  Administered 2021-07-14 – 2021-07-15 (×4): 650 mg via ORAL
  Filled 2021-07-12 (×3): qty 2

## 2021-07-12 MED ORDER — GADOBUTROL 1 MMOL/ML IV SOLN
5.0000 mL | Freq: Once | INTRAVENOUS | Status: AC | PRN
  Administered 2021-07-12: 5 mL via INTRAVENOUS

## 2021-07-12 MED ORDER — DEXAMETHASONE SODIUM PHOSPHATE 10 MG/ML IJ SOLN
10.0000 mg | Freq: Once | INTRAMUSCULAR | Status: AC
  Administered 2021-07-12: 10 mg via INTRAVENOUS
  Filled 2021-07-12: qty 1

## 2021-07-12 MED ORDER — CARVEDILOL 12.5 MG PO TABS
12.5000 mg | ORAL_TABLET | Freq: Two times a day (BID) | ORAL | Status: DC
  Administered 2021-07-12 – 2021-07-16 (×8): 12.5 mg via ORAL
  Filled 2021-07-12 (×8): qty 1

## 2021-07-12 MED ORDER — ACETAMINOPHEN 650 MG RE SUPP: 650.0000 mg | Freq: Four times a day (QID) | RECTAL | Status: DC | PRN

## 2021-07-12 MED ORDER — OXYCODONE-ACETAMINOPHEN 5-325 MG PO TABS: 1.0000 | ORAL_TABLET | Freq: Once | ORAL | Status: DC

## 2021-07-12 NOTE — ED Provider Notes (Signed)
Seen after prior EDP.  Patient with gradually progressive weakness of RUE and now RLE.  Worsening symptoms appear secondary to mass effect from known cervical lesion.   Patient is known to oncology.  Patient would benefit from admission for further oncologic work-up and treatment plan.  Hospitalist service is aware of case and will evaluate for admission.   Valarie Merino, MD 07/12/21 [phone]

## 2021-07-12 NOTE — ED Provider Notes (Signed)
[facility] DEPT Provider Note   CSN: [id number] Arrival date & time: 07/12/21  1206     History Chief Complaint  Patient presents with   Weakness    Lynn Wiley is a 65 y.o. female.   Weakness     Patient says that she was trying to go to an appointment today when she felt like her whole body just kind of gave out on her and her legs felt weak. The weakness comes and goes over the last few weeks. She denies any trouble speaking, vision changes, loss of consciousness, or palpitations.   Speaking with daughter later, says that patient had weakness on her left side from a prior stroke but has new onset right sided weakness that is new over the last several days, and that she also has a possible neck mass that she is unsure what the cause is.  Past Medical History:  Diagnosis Date   Cancer ([place])    COVID-19 06/22/2021   Family history of breast cancer    Family history of kidney cancer    Family history of neurofibromatosis    Hypertension    Neurofibromatosis ([place])    Stroke ([place])    Stroke ([place]) 06/23/2021    Patient Active Problem List   Diagnosis Date Noted   HTN (hypertension) 06/23/2021   Stroke ([place]) 06/23/2021   Bone metastases ([place]) 06/19/2021   Family history of breast cancer 06/17/2021   Family history of neurofibromatosis 06/17/2021   Family history of kidney cancer 06/17/2021   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/17/2021   Mass of upper lobe of left lung 06/15/2021    Past Surgical History:  Procedure Laterality Date   BRONCHIAL BIOPSY  07/06/2021   Procedure: BRONCHIAL BIOPSIES;  Surgeon: Garner Nash, DO;  Location: [facility];  Service: Pulmonary;;   BRONCHIAL BRUSHINGS  07/06/2021   Procedure: BRONCHIAL BRUSHINGS;  Surgeon: Garner Nash, DO;  Location: [facility];  Service: Pulmonary;;   BRONCHIAL NEEDLE ASPIRATION BIOPSY  07/06/2021   Procedure: BRONCHIAL NEEDLE ASPIRATION BIOPSIES;  Surgeon: Garner Nash, DO;   Location: [facility];  Service: Pulmonary;;   NO PAST SURGERIES     VIDEO BRONCHOSCOPY WITH ENDOBRONCHIAL NAVIGATION Left 07/06/2021   Procedure: VIDEO BRONCHOSCOPY WITH ENDOBRONCHIAL NAVIGATION;  Surgeon: Garner Nash, DO;  Location: [facility];  Service: Pulmonary;  Laterality: Left;  ION   VIDEO BRONCHOSCOPY WITH RADIAL ENDOBRONCHIAL ULTRASOUND  07/06/2021   Procedure: VIDEO BRONCHOSCOPY WITH RADIAL ENDOBRONCHIAL ULTRASOUND;  Surgeon: Garner Nash, DO;  Location: [facility];  Service: Pulmonary;;     OB History   No obstetric history on file.     Family History  Problem Relation Age of Onset   Neurofibromatosis Mother    Lung cancer Mother    Throat cancer Father        d. in his 41s   Neurofibromatosis Sister    Breast cancer Sister 77   Kidney cancer Brother        dx 22-60   Gastric cancer Brother        dx 29-60   Heart attack Brother    Neurofibromatosis Maternal Aunt    Neurofibromatosis Maternal Uncle    Breast cancer Paternal Aunt    Breast cancer Paternal Aunt    Cancer Paternal Uncle        NOS   Neurofibromatosis Maternal Grandmother    Cancer Cousin        possible ovarian cancer; 'abdominal cancer'    Social  History   Tobacco Use   Smoking status: Former    Packs/day: 0.50    Types: Cigarettes    Quit date: 2017    Years since quitting: 5.6   Smokeless tobacco: Never  Substance Use Topics   Alcohol use: Not Currently   Drug use: Never    Home Medications Prior to Admission medications   Medication Sig Start Date End Date Taking? Authorizing Provider  acetaminophen (TYLENOL) 650 MG CR tablet Take 1,300 mg by mouth every 8 (eight) hours as needed for pain.    [provider]  amLODipine (NORVASC) 10 MG tablet Take 10 mg by mouth 2 (two) times daily. 05/31/21   [provider]  ASPIRIN LOW DOSE 81 MG EC tablet Take 81 mg by mouth daily. 06/01/21   [provider]  carvedilol (COREG) 12.5 MG tablet Take 12.5 mg  by mouth 2 (two) times daily. 06/01/21   [provider]  dexamethasone (DECADRON) 4 MG tablet Take 1 tablet (4 mg total) by mouth 3 (three) times daily. Take with food. 06/15/21   Eppie Gibson, MD  LORazepam (ATIVAN) 0.5 MG tablet 1 tablet p.o. 30 minutes before the MRI.  Repeat once if needed 06/15/21   Curt Bears, MD  Omega-3 Fatty Acids (FISH OIL) 1200 MG CAPS Take 1,200 mg by mouth daily.    [provider]  omeprazole (PRILOSEC) 20 MG capsule Take 1 capsule (20 mg total) by mouth daily. Take while on Dexamethasone to protect stomach. 06/15/21   Eppie Gibson, MD  oxyCODONE-acetaminophen (PERCOCET) 5-325 MG tablet Take 1 tablet by mouth every 4 (four) hours as needed. Patient not taking: No sig reported 06/09/21   Little, Wenda Overland, MD  pravastatin (PRAVACHOL) 40 MG tablet Take 40 mg by mouth at bedtime. 06/01/21   [provider]  sennosides-docusate sodium (SENOKOT-S) 8.6-50 MG tablet Take 2 tablets by mouth daily.    [provider]  vitamin C (ASCORBIC ACID) 500 MG tablet Take 500 mg by mouth daily.    [provider]    Allergies    Patient has no known allergies.  Review of Systems   Review of Systems  Neurological:  Positive for weakness.   Physical Exam Updated Vital Signs BP (!) 166/83   Pulse 63   Temp 99 F (37.2 C)   Resp 18   SpO2 96%   Physical Exam Constitutional:      General: She is not in acute distress. Cardiovascular:     Rate and Rhythm: Normal rate and regular rhythm.  Pulmonary:     Effort: Pulmonary effort is normal.  Abdominal:     Tenderness: There is no abdominal tenderness.  Neurological:     General: No focal deficit present.     Mental Status: She is alert. Mental status is at baseline.     Motor: Weakness present.     Comments: Weakness of UE more pronounced than LE, worse on the left than the right    ED Results / Procedures / Treatments   Labs (all labs ordered are listed, but only  abnormal results are displayed) Labs Reviewed  BASIC METABOLIC PANEL - Abnormal; Notable for the following components:      Result Value   Glucose, Bld 282 (*)    BUN 33 (*)    Calcium 8.2 (*)    All other components within normal limits  CBC  URINALYSIS, ROUTINE W REFLEX MICROSCOPIC    EKG None  Radiology No results found.  Procedures Procedures   Medications Ordered in ED Medications - No data to display  ED Course  I have reviewed the triage vital signs and the nursing notes.  Pertinent labs & imaging results that were available during my care of the patient were reviewed by me and considered in my medical decision making (see chart for details).    MDM Rules/Calculators/A&P                           New onset right sided weakness, MRI Brain and C spine ordered to evaluate for mass as well as possible new stroke. BMP and CBC unremarkable for potential cause of weakness. Workup ongoing, disposition per Dr. Francia Greaves.   Final Clinical Impression(s) / ED Diagnoses Final diagnoses:  None    Rx / DC Orders ED Discharge Orders     None        Scarlett Presto, MD 07/12/21 1638    Lacretia Leigh, MD 07/13/21 [phone]

## 2021-07-12 NOTE — ED Provider Notes (Signed)
I saw and evaluated the patient, reviewed the resident's note and I agree with the findings and plan.  65 year old female presents with increasing right-sided weakness x6 days.  Patient has known cervical mass due to cancer.  States that she has had increasing weakness to her right lower extremity.  Does have history of CVA with persistent left-sided deficits.  No bowel or bladder dysfunction.  Patient have MRI of brain without and MRI of cervical spine with and without.  Disposition pending   Lacretia Leigh, MD 07/12/21 1539

## 2021-07-12 NOTE — Telephone Encounter (Signed)
Received VM from patient's daughter that patient is currently unable to stand/support herself to come in for her CT simulation appointment. Returned daughter's call to gather more information on patient's status. Adreka reports that patient's right sided weakness has progressively gotten worse, and because of her left-sided deficits from previous stroke she is unable to stand or support herself. Adreka denied any other neuro-like symptoms (no facial drooping, slurred speech, trouble finding words, headaches, vision changes, etc). Informed Adreka I would update Dr. Isidore Moos with above information, and call her back with an update. She verbalized understanding and agreement.   Updated Dr. Isidore Moos who recommended patient's family call EMS to bring patient to [facility]. That way if patient is admitted, one of radiation oncology's transporters can bring patient down for treatment planning session to hopefully begin palliative radiation asap.   Called Adreka and relayed Dr. Pearlie Oyster recommendations. She verbalized understanding and agreement of plan. Provided my direct call-back number should she or patient have any other questions/concerns.   Called and spoke with ED-charge nurse to give heads-up of patients upcoming arrival. Will continue to support as needed

## 2021-07-12 NOTE — ED Triage Notes (Signed)
Patient presents with bilateral leg weakness she has had for over a week now. Today the patient was so weak her family could not get her out of the car. She was seen by her MD last week and they were concerned about the mass on her neck and that she may have lung cancer. She has also had arm and leg numbness.    EMS vitals: 184/106 BP 76 HR 18 RR 98% SPO2 on room air 365 CBG 98.8 Temp

## 2021-07-12 NOTE — H&P (Signed)
Lynn Wiley OAC:[id number] DOB: 08-13-1956 DOA: 07/12/2021     PCP: Marzetta Board, NP   Outpatient Specialists:    Pulmonary   Dr.  Oncology Dr.Mohamed    Patient arrived to ER on 07/12/21 at 1206 Referred by Attending Valarie Merino, MD   Patient coming from: home Lives  With family    Chief Complaint:   Chief Complaint  Patient presents with   Weakness    HPI: Lynn Wiley is a 65 y.o. 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 Patient has been taking steroids at home and Decadron No fever no chills , no CP No double vision She quit smoking 5 y ago No etOH    She took her BP meds today   Has  been vaccinated against COVID     Initial COVID TEST  in house  PCR testing  Pending  Lab Results  Component Value Date   SARSCOV2NAA RESULT: POSITIVE (A) 06/22/2021     Regarding pertinent Chronic problems:     Hyperlipidemia -  on statins Pravachol Lipid Panel  No results found for: CHOL, TRIG, HDL, CHOLHDL, VLDL, LDLCALC, LDLDIRECT, LABVLDL   HTN on Norvasc Coreg    Hx of CVA -  with  residual deficits on Aspirin 81 mg,      While in ER: Noted to be having new left hand weakness MRI of HEAD MRI Cspine - mass with epidural extension  No cord abnormality     ED Triage Vitals  Enc Vitals Group     BP 07/12/21 1225 (!) 202/94     Pulse Rate 07/12/21 1225 69     Resp 07/12/21 1225 18     Temp 07/12/21 1225 99 F (37.2 C)     Temp src --      SpO2 07/12/21 1225 97 %     Weight --      Height --      Head Circumference --      Peak  Flow --      Pain Score 07/12/21 1226 0     Pain Loc --      Pain Edu? --      Excl. in [place]? --   TMAX(24)@     _________________________________________ Significant initial  Findings: Abnormal Labs Reviewed  CBC - Abnormal; Notable for the following components:      Result Value   WBC 12.6 (*)    Platelets 127 (*)    All other components within normal limits  BASIC METABOLIC PANEL - Abnormal; Notable for the following components:   Glucose, Bld 282 (*)    BUN 33 (*)    Calcium 8.2 (*)    All other components within normal limits   ____________________________________________ Ordered CT HEAD   NON acute chronic findings MRI brain stable metastatic lesions to the brain and skull no new strokes there is a remote lacunar infarct  MRI cervical neck C6 T1 marrow replacing lesions as well as C5 C7 with epidural extension at C7-T1 and T1-2 with mild mass-effect  on thecal sac but no canal stenosis or cord signal abnormality _Encasement of C6 and C5 vertebral arteries    The recent clinical data is shown below. Vitals:   07/12/21 1545 07/12/21 1600 07/12/21 1615 07/12/21 1915  BP: (!) 187/96 (!) 181/87 (!) 176/91 (!) 184/113  Pulse:  63  76  Resp:  18  18  Temp:      SpO2:  97%  100%  WBC     Component Value Date/Time   WBC 12.6 (H) 07/12/2021 1420   LYMPHSABS 1.1 06/15/2021 1044   MONOABS 0.6 06/15/2021 1044   EOSABS 0.1 06/15/2021 1044   BASOSABS 0.0 06/15/2021 1044     UA \\not  ordered  Results for orders placed or performed in visit on 06/22/21  SARS Coronavirus 2 (TAT 6-24 hrs)     Status: Abnormal   Collection Time: 06/22/21 12:00 AM  Result Value Ref Range Status   SARS Coronavirus 2 RESULT: POSITIVE (A)  Final    Comment: RESULT: POSITIVESARS-CoV-2 INTERPRETATION:A POSITIVE  test result means that SARS-CoV-2 RNA was present in the specimen above the limit of detection of this test. The presence of SARS-CoV-2 RNA is indicative of infection with SARS-CoV-2. Detection  of  SARS-CoV-2 RNA may not rule-out bacterial infection or co-infection with other viruses. Positive and negative predictive values of testing are highly dependent on prevalence. False positive test results are more likely when prevalence of disease is  low.The expected result is NEGATIVE.Fact Sheet for Healthcare Providers: SisterViews.hu Sheet for Patients: SwimConditioning.hu Reference Range - Negative      _______________________________________________________ ER Provider Called:   Oncology   Dr. Lindi Adie They Recommend admit to medicine  give steroids Decadrone 20mg  Will see in AM    _______________________________________________ Hospitalist was called for admission for    The following Work up has been ordered so far:  Orders Placed This Encounter  Procedures   SARS CORONAVIRUS 2 (TAT 6-24 HRS) Nasopharyngeal Nasopharyngeal Swab   CT HEAD WO CONTRAST (5MM)   MR BRAIN WO CONTRAST   MR Cervical Spine W or Wo Contrast   CBC   Basic metabolic panel   Urinalysis, Routine w reflex microscopic   Consult to hospitalist  Weakness   Consult to oncology  Weakness     Following Medications were ordered in ER: Medications  dexamethasone (DECADRON) injection 10 mg (has no administration in time range)  gadobutrol (GADAVIST) 1 MMOL/ML injection 5 mL (5 mLs Intravenous Contrast Given 07/12/21 1738)        Consult Orders  (From admission, onward)           Start     Ordered   07/12/21 1930  Consult to hospitalist  Weakness  Once       Comments: Weakness  Provider:  (Not yet assigned)  Question Answer Comment  Place call to: [facility]   Reason for Consult Admit      07/12/21 1931              OTHER Significant initial  Findings:  labs showing:    Recent Labs  Lab 07/06/21 0548 07/12/21 1420  NA 143 142  K 5.1 4.2  CO2 26 25  GLUCOSE 220* 282*  BUN 31* 33*  CREATININE 0.70 0.61  CALCIUM  8.2* 8.2*    Cr   stable,    Lab Results  Component Value Date   CREATININE 0.61 07/12/2021   CREATININE 0.70 07/06/2021   CREATININE 0.83 06/15/2021    No results for  input(s): AST, ALT, ALKPHOS, BILITOT, PROT, ALBUMIN in the last 168 hours. Lab Results  Component Value Date   CALCIUM 8.2 (L) 07/12/2021       Plt: Lab Results  Component Value Date   PLT 127 (L) 07/12/2021      COVID-19 Labs  No results for input(s): DDIMER, FERRITIN, LDH, CRP in the last 72 hours.  Lab Results  Component Value Date   SARSCOV2NAA RESULT: POSITIVE (A) 06/22/2021     Recent Labs  Lab 07/06/21 0548 07/12/21 1420  WBC 15.6* 12.6*  HGB 13.3 12.6  HCT 41.6 38.6  MCV 92.9 92.8  PLT 151 127*    HG/HCT   stable,     Component Value Date/Time   HGB 12.6 07/12/2021 1420   HGB 13.2 06/15/2021 1044   HCT 38.6 07/12/2021 1420   MCV 92.8 07/12/2021 1420    Radiological Exams on Admission: CT HEAD WO CONTRAST (5MM)  Result Date: 07/12/2021 CLINICAL DATA:  Fall, rule out hemorrhage EXAM: CT HEAD WITHOUT CONTRAST TECHNIQUE: Contiguous axial images were obtained from the base of the skull through the vertex without intravenous contrast. COMPARISON:  CT brain, 06/19/2021 MR brain, 07/02/2021 FINDINGS: Brain: No evidence of acute infarction, hemorrhage, hydrocephalus, extra-axial collection or mass lesion/mass effect. Extensive periventricular and deep white matter hypodensity with chronic lacunar infarctions of the bilateral corona radiata (series 2, image 19). Known left temporal metastases are not well appreciated by noncontrast CT. Vascular: No hyperdense vessel or unexpected calcification. Skull: Redemonstrated large partially calcified lytic lesion of the right parietal vertex (series 3, image 26) as well as a smaller lytic lesion involving the inner table of the more anterior right parietal lobe (series 3, image 22). Negative for fracture or focal lesion. Sinuses/Orbits: No acute finding.  Other: None. IMPRESSION: 1. No acute intracranial pathology. 2. Advanced small-vessel white matter disease with chronic lacunar infarctions of the bilateral corona radiata. 3. Known left temporal metastases seen on prior contrast enhanced MR are not well appreciated by noncontrast CT. 4. Redemonstrated large partially calcified lytic lesion of the right parietal vertex as well as a smaller lytic lesion involving the inner table of the more anterior right parietal lobe. Electronically Signed   By: Eddie Candle M.D.   On: 07/12/2021 15:22   MR BRAIN WO CONTRAST  Result Date: 07/12/2021 CLINICAL DATA:  Lung cancer. Metastatic disease. New onset right-sided weakness. EXAM: MRI HEAD WITHOUT CONTRAST TECHNIQUE: Multiplanar, multiecho pulse sequences of the brain and surrounding structures were obtained without intravenous contrast. COMPARISON:  Eighty crest pattern radiology area on malignant this FINDINGS: Brain: T2 and FLAIR signal changes are again noted in the previously identified metastatic brain lesions. The largest is in the inferior left temporal lobe measuring 17 mm. Additional lesions involving the left thalamus, posterior left temporal white matter in left frontal lobe are again noted. T2 signal changes associated with a lesion in the peripheral right cerebellum noted. Multiple remote lacunar infarcts are present in the periventricular white matter, right lentiform nucleus, right pons, and bilateral cerebellum without significant interval change. No acute infarct is present.  No acute hemorrhage is present. The internal auditory canals are within normal limits. Vascular: Flow is present in the major intracranial arteries. Skull and upper cervical spine: Metastatic lesions to the skull are stable. The largest lesion is in the right occipital skull, measuring over 6.5 cm in maximal dimension. Smaller lesion just anterior in the right parietal skull measures up to 17 mm. Multiple other smaller lesions are  again seen. See MRI of the cervical spine same day for description of metastatic disease in the cervical spine. Craniocervical junction is within normal limits. Sinuses/Orbits: Right mastoid effusion is again noted. The paranasal sinuses and mastoid air cells are otherwise clear. The globes and orbits are within normal limits. IMPRESSION: 1. Stable appearance of metastatic brain lesions. 2. Metastatic lesions to the skull are stable. 3. No acute intracranial abnormality to explain the new weakness. 4. Multiple remote lacunar infarcts involving the periventricular white matter, right lentiform nucleus, right pons, and bilateral cerebellum are stable. 5. Right mastoid effusion. Electronically Signed   By: San Morelle M.D.   On: 07/12/2021 18:08   MR Cervical Spine W or Wo Contrast  Result Date: 07/12/2021 CLINICAL DATA:  Metastatic disease evaluation EXAM: MRI CERVICAL SPINE WITHOUT AND WITH CONTRAST TECHNIQUE: Multiplanar and multiecho pulse sequences of the cervical spine, to include the craniocervical junction and cervicothoracic junction, were obtained without and with intravenous contrast. CONTRAST:  14mL GADAVIST GADOBUTROL 1 MMOL/ML IV SOLN COMPARISON:  06/07/2021 FINDINGS: Alignment: Reversal of the normal cervical lordosis. Trace anterolisthesis of C3 on C4. Vertebrae: Redemonstrated marrow replacing lesions in the C6 and T1 vertebral bodies, as well as the C5 and C7 vertebral bodies to a lesser extent, extending to the right pedicles and posterior elements. Again noted are bulky extraosseous soft tissue masses, which extend into the right foramina at C5-C6, C6-C7, C7-T1, and T1-T2. One total, the mass measures up to 5.9 x 3.2 x 3.6 cm (series 20, image 5 and series 16, image 27), previously 5.6 x 2.2 x 3.5 cm. The posterior aspect of the right first and second ribs are also likely involved. No pathologic fracture is seen. Cord: Normal signal and morphology. Extraosseous extension of tumor  posteriorly at C6, and on the right at C7 and T1, overall unchanged. No evidence of cord compression. Posterior Fossa, vertebral arteries, paraspinal tissues: Vertebral artery flow voids are maintained, although again noted is encasement of the right vertebral artery at the level of C6, as well as now at C5. Extension of the previously noted soft tissue masses in the right paraspinal soft tissues, as described above. Redemonstrated infarct in the pons. Disc levels: C2-C3: Disc osteophyte complex. Left greater than right facet arthropathy. No spinal canal stenosis. Moderate to severe left neural foraminal narrowing. C3-C4: Trace anterolisthesis with disc unroofing. Mild left greater than right facet arthropathy. No spinal canal stenosis. No neural foraminal narrowing. C4-C5: Minimal disc bulge. Uncovertebral and facet arthropathy. No spinal canal stenosis or neural foraminal narrowing. C5-C6: Disc height loss with minimal disc bulge. Uncovertebral and facet arthropathy. No spinal canal stenosis. Tumor is again seen in the right neural foramen. No epidural extension of tumor at this level. C6-C7: Disc osteophyte. Facet and uncovertebral hypertrophy. Tumor is again noted in the right neural foramen. No spinal canal stenosis. No epidural extension of tumor. C7-T1: Complete occlusion of the right neural foramen secondary to tumor. There is epidural extension, with mass effect on the thecal sac but no spinal canal stenosis. The left neural foramen is unremarkable. The right T1-T2 neural foramen also appears completely occluded secondary to tumor. IMPRESSION: 1. Redemonstrated marrow replacing lesions in the C6 and T1 vertebral bodies, as well as the C5 and C7 vertebral bodies to a lesser extent, primarily involving the right aspect of the vertebral bodies, pedicles, and posterior elements. This has slightly increased in size compared to the prior exam. Epidural extension of tumor at C7-T1 and T1-T2, with mild mass  effect  on the thecal sac but no significant canal stenosis or cord signal abnormality. Associated extraosseous soft tissue in the right-sided foramina again noted at C5-C6, C6-C7, C7-T1, and T1-T2. 2. Redemonstrated encasement of the right vertebral artery at the C6 level, now with some encasement at C5. The flow void is maintained. Electronically Signed   By: Merilyn Baba M.D.   On: 07/12/2021 18:32   _______________________________________________________________________________________________________ Latest  Blood pressure (!) 184/113, pulse 76, temperature 99 F (37.2 C), resp. rate 18, SpO2 100 %.   Review of Systems:    Pertinent positives include:   fatigue, localized weakness  Constitutional:  No weight loss, night sweats, Fevers, chills,weight loss  HEENT:  No headaches, Difficulty swallowing,Tooth/dental problems,Sore throat,  No sneezing, itching, ear ache, nasal congestion, post nasal drip,  Cardio-vascular:  No chest pain, Orthopnea, PND, anasarca, dizziness, palpitations.no Bilateral lower extremity swelling  GI:  No heartburn, indigestion, abdominal pain, nausea, vomiting, diarrhea, change in bowel habits, loss of appetite, melena, blood in stool, hematemesis Resp:  no shortness of breath at rest. No dyspnea on exertion, No excess mucus, no productive cough, No non-productive cough, No coughing up of blood.No change in color of mucus.No wheezing. Skin:  no rash or lesions. No jaundice GU:  no dysuria, change in color of urine, no urgency or frequency. No straining to urinate.  No flank pain.  Musculoskeletal:  No joint pain or no joint swelling. No decreased range of motion. No back pain.  Psych:  No change in mood or affect. No depression or anxiety. No memory loss.  Neuro: no localizing neurological complaints, no tingling, no weakness, no double vision, no gait abnormality, no slurred speech, no confusion  All systems reviewed and apart from [place] all are  negative _______________________________________________________________________________________________ Past Medical History:   Past Medical History:  Diagnosis Date   Cancer ([place])    COVID-19 06/22/2021   Family history of breast cancer    Family history of kidney cancer    Family history of neurofibromatosis    Hypertension    Neurofibromatosis ([place])    Stroke ([place])    Stroke ([place]) 06/23/2021      Past Surgical History:  Procedure Laterality Date   BRONCHIAL BIOPSY  07/06/2021   Procedure: BRONCHIAL BIOPSIES;  Surgeon: Garner Nash, DO;  Location: [facility];  Service: Pulmonary;;   BRONCHIAL BRUSHINGS  07/06/2021   Procedure: BRONCHIAL BRUSHINGS;  Surgeon: Garner Nash, DO;  Location: [facility];  Service: Pulmonary;;   BRONCHIAL NEEDLE ASPIRATION BIOPSY  07/06/2021   Procedure: BRONCHIAL NEEDLE ASPIRATION BIOPSIES;  Surgeon: Garner Nash, DO;  Location: [facility];  Service: Pulmonary;;   NO PAST SURGERIES     VIDEO BRONCHOSCOPY WITH ENDOBRONCHIAL NAVIGATION Left 07/06/2021   Procedure: VIDEO BRONCHOSCOPY WITH ENDOBRONCHIAL NAVIGATION;  Surgeon: Garner Nash, DO;  Location: [facility];  Service: Pulmonary;  Laterality: Left;  ION   VIDEO BRONCHOSCOPY WITH RADIAL ENDOBRONCHIAL ULTRASOUND  07/06/2021   Procedure: VIDEO BRONCHOSCOPY WITH RADIAL ENDOBRONCHIAL ULTRASOUND;  Surgeon: Garner Nash, DO;  Location: [facility];  Service: Pulmonary;;    Social History:  Ambulatory  needs assist with walking      reports that she quit smoking about 5 years ago. Her smoking use included cigarettes. She smoked an average of .5 packs per day. She has never used smokeless tobacco. She reports that she does not currently use alcohol. She reports that she does not use drugs.     Family History:   Family  History  Problem Relation Age of Onset   Neurofibromatosis Mother    Lung cancer Mother    Throat cancer Father        d. in his 55s   Neurofibromatosis  Sister    Breast cancer Sister 25   Kidney cancer Brother        dx 50-60   Gastric cancer Brother        dx 58-60   Heart attack Brother    Neurofibromatosis Maternal Aunt    Neurofibromatosis Maternal Uncle    Breast cancer Paternal Aunt    Breast cancer Paternal Aunt    Cancer Paternal Uncle        NOS   Neurofibromatosis Maternal Grandmother    Cancer Cousin        possible ovarian cancer; 'abdominal cancer'   ______________________________________________________________________________________________ Allergies: No Known Allergies   Prior to Admission medications   Medication Sig Start Date End Date Taking? Authorizing Provider  acetaminophen (TYLENOL) 650 MG CR tablet Take 1,300 mg by mouth daily.   Yes [provider]  amLODipine (NORVASC) 10 MG tablet Take 10 mg by mouth 2 (two) times daily. 05/31/21  Yes [provider]  ASPIRIN LOW DOSE 81 MG EC tablet Take 81 mg by mouth daily. 06/01/21  Yes [provider]  carvedilol (COREG) 12.5 MG tablet Take 12.5 mg by mouth 2 (two) times daily. 06/01/21  Yes [provider]  dexamethasone (DECADRON) 4 MG tablet Take 1 tablet (4 mg total) by mouth 3 (three) times daily. Take with food. 06/15/21  Yes Eppie Gibson, MD  Ibuprofen-diphenhydrAMINE Cit (ADVIL PM PO) Take 2 tablets by mouth at bedtime.   Yes [provider]  Omega-3 Fatty Acids (FISH OIL) 1200 MG CAPS Take 1,200 mg by mouth daily.   Yes [provider]  omeprazole (PRILOSEC) 20 MG capsule Take 1 capsule (20 mg total) by mouth daily. Take while on Dexamethasone to protect stomach. 06/15/21  Yes Eppie Gibson, MD  pravastatin (PRAVACHOL) 40 MG tablet Take 40 mg by mouth at bedtime. 06/01/21  Yes [provider]  sennosides-docusate sodium (SENOKOT-S) 8.6-50 MG tablet Take 2 tablets by mouth daily.   Yes [provider]  vitamin C (ASCORBIC ACID) 500 MG tablet Take 500 mg by mouth daily.   Yes [provider]  LORazepam (ATIVAN) 0.5 MG tablet 1 tablet p.o. 30 minutes before the MRI.  Repeat once if needed Patient not taking: Reported on 07/12/2021 06/15/21   Curt Bears, MD  oxyCODONE-acetaminophen (PERCOCET) 5-325 MG tablet Take 1 tablet by mouth every 4 (four) hours as needed. Patient not taking: No sig reported 06/09/21   Little, Wenda Overland, MD    ___________________________________________________________________________________________________ Physical Exam: Vitals with BMI 07/12/2021 07/12/2021 07/12/2021  Height - - -  Weight - - -  BMI - - -  Systolic [phone]  Diastolic 810 91 87  Pulse 76 - 63     1. General:  in No  Acute distress   Chronically ill   2. Psychological: Alert and  Oriented 3. Head/ENT:    Dry Mucous Membranes                          Head Non traumatic, neck supple                         Poor Dentition 4. SKIN:  decreased Skin turgor,  Skin clean  Dry and intact no rash 5. Heart: Regular rate and rhythm no  Murmur, no Rub or gallop 6. Lungs:   no wheezes or crackles   7. Abdomen: Soft, non-tender, Non distended   obese   8. Lower extremities: no clubbing, cyanosis, no  edema 9. Neurologically bilateral upper extremity weakness  As well as lower extremity weakness that is new 10. MSK: Normal range of motion    Chart has been reviewed  ______________________________________________________________________________________________  Assessment/Plan 65 y.o. 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  Resume home medications and continue to monitor treat as needed   Bone metastases ([place])  -cervical spine metastases now resulting in progression of lower extremity weakness.  Oncology is aware Probably will need palliative Radiation therapy in AM.  Continue Decadron   Hyperglycemia, drug-induced -in the setting of steroid use.  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  DVT prophylaxis:  SCD     Code Status:    Code Status: Not on file FULL CODE  as per patient   I had personally discussed CODE STATUS with patient and family    Family Communication:   Family   at  Bedside  plan of care was discussed   with  Daughter,   Disposition Plan:   likely will need placement for rehabilitation   Following barriers for discharge:                           Palliative radiation therapy initiated                           Will need consultants to evaluate patient prior to discharge                        Would benefit from PT/OT eval prior to DC  Ordered                     Consults called: Oncology has been emailed and aware  Admission status:  ED Disposition     ED Disposition  Admit   Condition  --   [place]: [facility] [[id number]]  Level of Care: Telemetry [5]  Admit to tele based on following criteria: Other see comments  Comments: accelrated HTn  May admit patient to [facility] or [facility] if equivalent level of care is available:: No  Covid Evaluation: Recent COVID positive no isolation required infection day 21-90  Diagnosis: Right leg weakness [[id number]]  Admitting Physician: Toy Baker [[id number]]  Attending Physician: Toy Baker [[id number]]  Estimated length of stay: past midnight tomorrow  Certification:: I certify this patient will need inpatient services for at least 2 midnights              inpatient     I Expect 2 midnight  stay secondary to severity of patient's current illness  need for inpatient interventions justified by the following:  Severe lab/radiological/exam abnormalities including:   Progression of cervical metastases  and extensive comorbidities including:  Malignancy   That are currently affecting medical management.   I expect  patient to be hospitalized for 2 midnights requiring inpatient medical care.  Patient is at high risk for adverse outcome (such as loss of life or disability) if not treated.  Indication for inpatient stay as follows:   Need for operative/procedural  intervention    Need for radiation therapy and IV steroids    Level of care     tele  For  24H          Lab Results  Component Value Date   SARSCOV2NAA RESULT: POSITIVE (A) 06/22/2021     Precautions: admitted as patient has tested positive for COVID within the past 90 days.  But has recovered since PPE: Used by the provider:   N95  eye Goggles,  Gloves     Bryce Kimble 07/12/2021, 1:29 AM    [facility]     after 2 AM please page floor coverage PA If 7AM-7PM, please contact the day team taking care of the patient using [email or web address]   Patient was evaluated in the context of the global COVID-19 pandemic, which necessitated consideration that the patient might be at risk for infection with the SARS-CoV-2 virus that causes COVID-19. Institutional protocols and algorithms that pertain to the evaluation of patients at risk for COVID-19 are in a state of rapid change based on information released by regulatory bodies including the CDC and federal and state organizations. These policies and algorithms were followed during the patient's care.

## 2021-07-13 ENCOUNTER — Telehealth: Payer: Self-pay | Admitting: Radiation Therapy

## 2021-07-13 DIAGNOSIS — T50905A Adverse effect of unspecified drugs, medicaments and biological substances, initial encounter: Secondary | ICD-10-CM | POA: Diagnosis present

## 2021-07-13 DIAGNOSIS — C3490 Malignant neoplasm of unspecified part of unspecified bronchus or lung: Secondary | ICD-10-CM | POA: Diagnosis not present

## 2021-07-13 DIAGNOSIS — L8990 Pressure ulcer of unspecified site, unspecified stage: Secondary | ICD-10-CM | POA: Insufficient documentation

## 2021-07-13 DIAGNOSIS — R7989 Other specified abnormal findings of blood chemistry: Secondary | ICD-10-CM

## 2021-07-13 LAB — CBC WITH DIFFERENTIAL/PLATELET
Abs Immature Granulocytes: 0.11 10*3/uL — ABNORMAL HIGH (ref 0.00–0.07)
Basophils Absolute: 0 10*3/uL (ref 0.0–0.1)
Basophils Relative: 0 %
Eosinophils Absolute: 0 10*3/uL (ref 0.0–0.5)
Eosinophils Relative: 0 %
HCT: 38.8 % (ref 36.0–46.0)
Hemoglobin: 12 g/dL (ref 12.0–15.0)
Immature Granulocytes: 1 %
Lymphocytes Relative: 5 %
Lymphs Abs: 0.5 10*3/uL — ABNORMAL LOW (ref 0.7–4.0)
MCH: 29.9 pg (ref 26.0–34.0)
MCHC: 30.9 g/dL (ref 30.0–36.0)
MCV: 96.5 fL (ref 80.0–100.0)
Monocytes Absolute: 0.2 10*3/uL (ref 0.1–1.0)
Monocytes Relative: 2 %
Neutro Abs: 10.3 10*3/uL — ABNORMAL HIGH (ref 1.7–7.7)
Neutrophils Relative %: 92 %
Platelets: 118 10*3/uL — ABNORMAL LOW (ref 150–400)
RBC: 4.02 MIL/uL (ref 3.87–5.11)
RDW: 14.8 % (ref 11.5–15.5)
WBC: 11.1 10*3/uL — ABNORMAL HIGH (ref 4.0–10.5)
nRBC: 0 % (ref 0.0–0.2)

## 2021-07-13 LAB — TSH: TSH: 0.168 u[IU]/mL — ABNORMAL LOW (ref 0.350–4.500)

## 2021-07-13 LAB — COMPREHENSIVE METABOLIC PANEL
ALT: 18 U/L (ref 0–44)
AST: 12 U/L — ABNORMAL LOW (ref 15–41)
Albumin: 2.6 g/dL — ABNORMAL LOW (ref 3.5–5.0)
Alkaline Phosphatase: 49 U/L (ref 38–126)
Anion gap: 9 (ref 5–15)
BUN: 32 mg/dL — ABNORMAL HIGH (ref 8–23)
CO2: 23 mmol/L (ref 22–32)
Calcium: 8.4 mg/dL — ABNORMAL LOW (ref 8.9–10.3)
Chloride: 108 mmol/L (ref 98–111)
Creatinine, Ser: 0.46 mg/dL (ref 0.44–1.00)
GFR, Estimated: >60 mL/min (ref >60)
Glucose, Bld: 223 mg/dL — ABNORMAL HIGH (ref 70–99)
Potassium: 3.9 mmol/L (ref 3.5–5.1)
Sodium: 140 mmol/L (ref 135–145)
Total Bilirubin: 0.5 mg/dL (ref 0.3–1.2)
Total Protein: 5 g/dL — ABNORMAL LOW (ref 6.5–8.1)

## 2021-07-13 LAB — GLUCOSE, CAPILLARY
Glucose-Capillary: 244 mg/dL — ABNORMAL HIGH (ref 70–99)
Glucose-Capillary: 239 mg/dL — ABNORMAL HIGH (ref 70–99)
Glucose-Capillary: 158 mg/dL — ABNORMAL HIGH (ref 70–99)
Glucose-Capillary: 236 mg/dL — ABNORMAL HIGH (ref 70–99)

## 2021-07-13 LAB — MAGNESIUM: Magnesium: 1.9 mg/dL (ref 1.7–2.4)

## 2021-07-13 LAB — PHOSPHORUS: Phosphorus: 3.6 mg/dL (ref 2.5–4.6)

## 2021-07-13 LAB — HEMOGLOBIN A1C
Hgb A1c MFr Bld: 8 % — ABNORMAL HIGH (ref 4.8–5.6)
Mean Plasma Glucose: 182.9 mg/dL

## 2021-07-13 LAB — HIV ANTIBODY (ROUTINE TESTING W REFLEX): HIV Screen 4th Generation wRfx: NONREACTIVE

## 2021-07-13 LAB — SARS CORONAVIRUS 2 (TAT 6-24 HRS): SARS Coronavirus 2: NEGATIVE

## 2021-07-13 MED ORDER — PANTOPRAZOLE SODIUM 40 MG PO TBEC
40.0000 mg | DELAYED_RELEASE_TABLET | Freq: Every day | ORAL | Status: DC
  Administered 2021-07-13 – 2021-07-16 (×4): 40 mg via ORAL
  Filled 2021-07-13 (×4): qty 1

## 2021-07-13 MED ORDER — INSULIN ASPART 100 UNIT/ML IJ SOLN
0.0000 [IU] | Freq: Three times a day (TID) | INTRAMUSCULAR | Status: DC
  Administered 2021-07-13: 2 [IU] via SUBCUTANEOUS
  Administered 2021-07-14: 3 [IU] via SUBCUTANEOUS
  Administered 2021-07-14 (×2): 5 [IU] via SUBCUTANEOUS
  Administered 2021-07-15: 2 [IU] via SUBCUTANEOUS
  Administered 2021-07-15 (×2): 3 [IU] via SUBCUTANEOUS
  Administered 2021-07-16: 5 [IU] via SUBCUTANEOUS
  Administered 2021-07-16: 2 [IU] via SUBCUTANEOUS

## 2021-07-13 MED ORDER — SENNOSIDES-DOCUSATE SODIUM 8.6-50 MG PO TABS
2.0000 | ORAL_TABLET | Freq: Every day | ORAL | Status: DC
  Administered 2021-07-13 – 2021-07-16 (×4): 2 via ORAL
  Filled 2021-07-13 (×5): qty 2

## 2021-07-13 MED ORDER — SODIUM CHLORIDE 0.9% FLUSH
3.0000 mL | Freq: Two times a day (BID) | INTRAVENOUS | Status: DC
  Administered 2021-07-13 – 2021-07-16 (×7): 3 mL via INTRAVENOUS

## 2021-07-13 MED ORDER — PRAVASTATIN SODIUM 40 MG PO TABS
40.0000 mg | ORAL_TABLET | Freq: Every day | ORAL | Status: DC
  Administered 2021-07-13 – 2021-07-15 (×4): 40 mg via ORAL
  Filled 2021-07-13 (×4): qty 1

## 2021-07-13 MED ORDER — INSULIN ASPART 100 UNIT/ML IJ SOLN
0.0000 [IU] | INTRAMUSCULAR | Status: DC
  Administered 2021-07-13 (×2): 2 [IU] via SUBCUTANEOUS
  Administered 2021-07-13 (×2): 3 [IU] via SUBCUTANEOUS

## 2021-07-13 MED ORDER — ASPIRIN EC 81 MG PO TBEC
81.0000 mg | DELAYED_RELEASE_TABLET | Freq: Every day | ORAL | Status: DC
  Administered 2021-07-13 – 2021-07-16 (×4): 81 mg via ORAL
  Filled 2021-07-13 (×4): qty 1

## 2021-07-13 MED ORDER — SODIUM CHLORIDE 0.9 % IV SOLN: 250.0000 mL | INTRAVENOUS | Status: DC | PRN

## 2021-07-13 MED ORDER — DEXAMETHASONE SODIUM PHOSPHATE 10 MG/ML IJ SOLN
4.0000 mg | Freq: Three times a day (TID) | INTRAMUSCULAR | Status: DC
  Administered 2021-07-13 – 2021-07-15 (×8): 4 mg via INTRAVENOUS
  Filled 2021-07-13: qty 1
  Filled 2021-07-13: qty 0.4
  Filled 2021-07-13 (×7): qty 1

## 2021-07-13 MED ORDER — HYDROCODONE-ACETAMINOPHEN 5-325 MG PO TABS
1.0000 | ORAL_TABLET | ORAL | Status: DC | PRN
  Administered 2021-07-13 (×2): 1 via ORAL
  Filled 2021-07-13 (×2): qty 1

## 2021-07-13 MED ORDER — INSULIN ASPART 100 UNIT/ML IJ SOLN
0.0000 [IU] | Freq: Every day | INTRAMUSCULAR | Status: DC
Start: 2021-07-13 — End: 2021-07-17
  Administered 2021-07-13: 4 [IU] via SUBCUTANEOUS
  Administered 2021-07-14: 3 [IU] via SUBCUTANEOUS
  Administered 2021-07-15: 2 [IU] via SUBCUTANEOUS

## 2021-07-13 MED ORDER — SODIUM CHLORIDE 0.9% FLUSH: 3.0000 mL | INTRAVENOUS | Status: DC | PRN

## 2021-07-13 MED ORDER — AMLODIPINE BESYLATE 10 MG PO TABS
10.0000 mg | ORAL_TABLET | Freq: Two times a day (BID) | ORAL | Status: DC
  Administered 2021-07-13 – 2021-07-16 (×8): 10 mg via ORAL
  Filled 2021-07-13 (×8): qty 1

## 2021-07-13 NOTE — Telephone Encounter (Signed)
Spoke with pt's daughter to let her know we will be bringing Ms. Tromp down to the radiation oncology department tomorrow for her to discuss radiation treatment options with Dr. Isidore Moos. They were happy for the call and the daughter plans to come down with her to be present for the discussion.   Mont Dutton R.T.(R)(T) Radiation Special Procedures Navigator

## 2021-07-13 NOTE — Progress Notes (Signed)
Radiation Oncology         [phone] ________________________________  Re-Consultation  Inpatient   Name: Lynn Wiley MRN: [id number]  Date: 07/14/2021  DOB: October 19, 1956  XB:JYNWGN, Caren Griffins, NP  Curt Bears, MD   REFERRING PHYSICIAN: Curt Bears, MD  DIAGNOSIS: Primary malignant neoplasm of lung metastatic to other site, unspecified laterality [facility]).     ICD-10-CM   1. Bone metastases (HCC)  C79.51     2. Brain metastases ([place])  C79.31       Stage IV small cell lung cancer; now with cervical and brain metastases  CHIEF COMPLAINT: Here to discuss management of lung cancer with cervical and brain metastases  HISTORY OF PRESENT ILLNESS::Lynn Wiley is a 65 y.o. female who presented with right neck pain and right hand weakness to the [facility] ED on 06/09/21. The patient stated that since this past May, the right sided neck pain has been progressively worsening; with the pain going into her shoulder and down her arm. In the week prior to her ED visit, she stated that her right hand weakness became worse. She stated that she is able to lift and move her arm okay despite having mild left arm weakness due to history of CVA. MRI taken during this visit (she was seen at the [facility] clinic that day) revealed a likely metastatic lesion if the cervical spine. The patient denied any abdominal pain, vomiting, diarrhea, bloody stools, headaches, vision changes, night sweats, chills, fever, or urinary symptoms. (Patient was discharged the same day).   Furthermore, CT of the abdomen and pelvis with contrast taken on 06/09/21 demonstrated: a 3.5 x 3.0 cm lobulated left upper lobe mass concerning for primary lung neoplasm, as well as some small right upper lobe nodules suspicious for pulmonary metastatic nodules. Also seen was a destructive lytic lesion involving the right T1 vertebral body and transverse process and possibly part of the first right rib, as well as a   2.0 x 2.0 cm left adrenal gland lesion; consistent with metastatic disease. Of note: other thoracic spine metastatic disease noted on recent cervical spine MRI taken during ED visit (results pending).   CT of the head taken on 06/19/21 demonstrated a large 6.1 cm destructive/lytic lesion involving the posterior right calvarium, with associated soft tissue mass and possible intracranial/dural extension. An adjacent smaller lytic lesion was also noted more anteriorly. Findings were overall noted to be concerning for osseous metastatic disease given findings on recent chest CT. Imaging also revealed an approximately 1 cm hypodense lesion; posterior to the left temporal horn, noted as potentially a choroid fissure cyst or sequela of prior infarct.   MRI of the brain performed on 07/02/21 further demonstrated at least 3 brain metastases measuring up to 21 mm in the inferior left temporal lobe, with possibly 2 additional sub-centimeter metastases based on diffusion imaging. Also visualized were lytic lesions in the right parietal bone extending to the dura. Chest CT taken on this same date demonstrated a 4.0 cm aggregate left upper lobe mass with adjacent 13 mm central left upper lobe nodule; corresponding to the patient's known primary bronchogenic neoplasm. Also seen was an incompletely visualized, 2.5 cm left adrenal metastasis, as well as as a destructive lytic metastasis involving the right T1 vertebral body and transverse process.  Accordingly, the patient underwent LUL biopsies on 07/06/21 under the care of Dr. Valeta Harms. Pathology from the procedure revealed malignant cells present, most consistent with small cell carcinoma. During physical examined performed prior to  bronchoscopy, patient was found to have decreased breath sounds throughout all lung fields, muscle wasting, weakness, and an abnormal gate  Of note, she was scheduled for CT simulation of her cervical spine but this was delayed due to COVID  diagnosis.  The patient recently presented to the ER on 07/12/21 and was admitted due to new neurological changes in the setting of cervical metastasis. MRI taken during hospital course demonstrated the stable appearance of metastatic brian lesions. No acute intracranial abnormalities were found to explain new onset of weakness. MRI of the cervical spine also taken redemonstrated the marrow replacing lesions in the C6 and T1 vertebral bodies, as well as the C5 and C7 vertebral bodies to a lesser extent; noted to primarily involve the right aspect of the vertebral bodies, pedicles, and posterior elements. Lesions were noted to have slightly increased in size compared to the prior exam. Epidural extension of tumor at C7-T1 and T1-T2 also noted; with mild mass effect on the thecal sac, with no significant canal stenosis or cord signal abnormality visualized. The patient is currently admitted as inpatient.    Tobacco/Marijuana/Snuff/ETOH use: Former smoker (quit in 2017 after stroke).    Dose of Decadron, if applicable: receiving 4mg  IV every 8 hours while in-patient  Recent neurologic symptoms, if any:  Seizures: Patient denies Headaches: Reports occasional/mild occurrences. 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 Continues to have bilateral weakness to her lower extremity and diminshed use of upper extremity Visual deficits/changes: Patient denies Confusion/Memory deficits: Patient denies  Ambulatory status? Walker? Wheelchair?:  From PT/OT evaluations:  --PT: Min assist for bed mobility. Min assist of 2 for stand  pivot transfer. Pt fatigues quickly with minimal activity. I instructed pt/daughter in BLE strengthening exercises to be done independently to minimize deconditioning during hospitalization. Daughter present and stated she feels she can manage her mom's care at home with a WC. She plans to have a ramp built. If ramp isn't in place at time of DC, pt would likely need PTAR assist for entering home. Pt had 2 falls in the week prior to admission so is a high fall risk.  Pt currently with functional limitations due to the deficits --OT: PTA pt needed min A with bathing, dressing, functional transfers and short distance mobility. setup with meals. Pt currently needs up to mod A with LB ADLs and stand-pivot transfers;  SAFETY ISSUES: Prior radiation? No Pacemaker/ICD? No Possible current pregnancy? No--postmenopausal Is the patient on methotrexate? No     PREVIOUS RADIATION THERAPY: No  PAST MEDICAL HISTORY:  has a past medical history of Cancer [facility]), COVID-19 (06/22/2021), Family history of breast cancer, Family history of kidney cancer, Family history of neurofibromatosis, Hypertension, Neurofibromatosis ([place]), Stroke ([facility]), and Stroke ([place]) (06/23/2021). And neurofibromatosis  PAST SURGICAL HISTORY: Past Surgical History:  Procedure Laterality Date   BRONCHIAL BIOPSY  07/06/2021   Procedure: BRONCHIAL BIOPSIES;  Surgeon: Garner Nash, DO;  Location: [place] [facility];  Service: Pulmonary;;   BRONCHIAL BRUSHINGS  07/06/2021   Procedure: BRONCHIAL BRUSHINGS;  Surgeon: Garner Nash, DO;  Location: [place] [facility];  Service: Pulmonary;;   BRONCHIAL NEEDLE ASPIRATION BIOPSY  07/06/2021   Procedure: BRONCHIAL NEEDLE ASPIRATION BIOPSIES;  Surgeon:  Garner Nash, DO;  Location: [place] [facility];  Service: Pulmonary;;   NO PAST SURGERIES     VIDEO BRONCHOSCOPY WITH ENDOBRONCHIAL NAVIGATION Left 07/06/2021   Procedure: VIDEO BRONCHOSCOPY WITH ENDOBRONCHIAL NAVIGATION;  Surgeon: Garner Nash, DO;   Location: [place];  Service: Pulmonary;  Laterality: Left;  ION   VIDEO BRONCHOSCOPY WITH RADIAL ENDOBRONCHIAL ULTRASOUND  07/06/2021   Procedure: VIDEO BRONCHOSCOPY WITH RADIAL ENDOBRONCHIAL ULTRASOUND;  Surgeon: Garner Nash, DO;  Location: [facility];  Service: Pulmonary;;    FAMILY HISTORY: family history includes Breast cancer in her paternal aunt and paternal aunt; Breast cancer (age of onset: 17) in her sister; Cancer in her cousin and paternal uncle; Gastric cancer in her brother; Heart attack in her brother; Kidney cancer in her brother; Lung cancer in her mother; Neurofibromatosis in her maternal aunt, maternal grandmother, maternal uncle, mother, and sister; Throat cancer in her father.  SOCIAL HISTORY:  reports that she quit smoking about 5 years ago. Her smoking use included cigarettes. She smoked an average of .5 packs per day. She has never used smokeless tobacco. She reports that she does not currently use alcohol. She reports that she does not use drugs.  ALLERGIES: Patient has no known allergies.  MEDICATIONS: (Completed prednisone taper on day of ED visit ;06/09/21) Current Outpatient Medications  Medication Sig Dispense Refill   acetaminophen (TYLENOL) 325 MG tablet Take 2 tablets (650 mg total) by mouth every 6 (six) hours as needed for mild pain (or Fever >/= 101).     amLODipine (NORVASC) 10 MG tablet Take 10 mg by mouth 2 (two) times daily.     ASPIRIN LOW DOSE 81 MG EC tablet Take 81 mg by mouth daily.     carvedilol (COREG) 12.5 MG tablet Take 12.5 mg by mouth 2 (two) times daily.     Continuous Blood Gluc Receiver (FREESTYLE LIBRE 2 READER) DEVI 1 Units by Does not apply route daily. 1 each 0   Continuous Blood Gluc Sensor (FREESTYLE LIBRE 2 SENSOR) MISC To use with monitor 1 each 2   dexamethasone (DECADRON) 4 MG tablet Take 1 tablet (4 mg total) by mouth 3 (three) times daily. Take with food. 90 tablet 1   insulin glargine (LANTUS) 100 UNIT/ML Solostar Pen  Inject 12 Units into the skin daily. 15 mL 11   insulin lispro (HUMALOG) 100 UNIT/ML KwikPen Inject 3 units into the skin 3 times daily as directed with meals if eating 50%. PLUS  SSI: CBG 121-150: 1 unit CBG 151-200: 2 units CBG 201-250: 3 units CBG 251-300: 5 units CBG 301-350: 7 units CBG 351-400: 9 units 15 mL 11   Insulin Pen Needle 32G X 4 MM MISC Use with insulin pens as directed. 200 each 1   Nystatin (GERHARDT'S BUTT CREAM) CREA Apply 1 application topically 4 (four) times daily. 1 each 0   Omega-3 Fatty Acids (FISH OIL) 1200 MG CAPS Take 1,200 mg by mouth daily.     pantoprazole (PROTONIX) 40 MG tablet Take 1 tablet (40 mg total) by mouth daily. 30 tablet 0   pravastatin (PRAVACHOL) 40 MG tablet Take 40 mg by mouth at bedtime.     sennosides-docusate sodium (SENOKOT-S) 8.6-50 MG tablet Take 2 tablets by mouth daily.     vitamin C (ASCORBIC ACID) 500 MG tablet Take 500 mg by mouth daily.     No current facility-administered medications for this encounter.    REVIEW OF SYSTEMS:  Notable for that above.   PHYSICAL  EXAM:  vitals were not taken for this visit.   General: Alert and oriented, in no acute distress  -- in gurney Musculoskeletal: diminished use of both hands (left from previous stroke, and right from current conditition); slight numbness to her right hand; bilateral weakness to her lower extremities and diminshed use of right arm Neurologic:  Speech is fluent.  See above. Psychiatric: Judgment and insight are intact.    KPS = 40  100 - Normal; no complaints; no evidence of disease. 90   - Able to carry on normal activity; minor signs or symptoms of disease. 80   - Normal activity with effort; some signs or symptoms of disease. 37   - Cares for self; unable to carry on normal activity or to do active work. 60   - Requires occasional assistance, but is able to care for most of his personal needs. 50   - Requires considerable assistance and frequent medical care. 27   -  Disabled; requires special care and assistance. 76   - Severely disabled; hospital admission is indicated although death not imminent. 21   - Very sick; hospital admission necessary; active supportive treatment necessary. 10   - Moribund; fatal processes progressing rapidly. 0     - Dead  Karnofsky DA, Abelmann WH, Craver LS and Blanchard JH [phone]) The use of the nitrogen mustards in the palliative treatment of carcinoma: with particular reference to bronchogenic carcinoma Cancer 1 [id number]    LABORATORY DATA:  Lab Results  Component Value Date   WBC 11.1 (H) 07/13/2021   HGB 12.0 07/13/2021   HCT 38.8 07/13/2021   MCV 96.5 07/13/2021   PLT 118 (L) 07/13/2021   CMP     Component Value Date/Time   NA 140 07/13/2021 0456   K 3.9 07/13/2021 0456   CL 108 07/13/2021 0456   CO2 23 07/13/2021 0456   GLUCOSE 223 (H) 07/13/2021 0456   BUN 32 (H) 07/13/2021 0456   CREATININE 0.46 07/13/2021 0456   CREATININE 0.83 06/15/2021 1044   CALCIUM 8.4 (L) 07/13/2021 0456   PROT 5.0 (L) 07/13/2021 0456   ALBUMIN 2.6 (L) 07/13/2021 0456   AST 12 (L) 07/13/2021 0456   AST 15 06/15/2021 1044   ALT 18 07/13/2021 0456   ALT 12 06/15/2021 1044   ALKPHOS 49 07/13/2021 0456   BILITOT 0.5 07/13/2021 0456   BILITOT 0.3 06/15/2021 1044   GFRNONAA >60 07/13/2021 0456   GFRNONAA >60 06/15/2021 1044         RADIOGRAPHY: CT HEAD WO CONTRAST (5MM)  Result Date: 07/12/2021 CLINICAL DATA:  Fall, rule out hemorrhage EXAM: CT HEAD WITHOUT CONTRAST TECHNIQUE: Contiguous axial images were obtained from the base of the skull through the vertex without intravenous contrast. COMPARISON:  CT brain, 06/19/2021 MR brain, 07/02/2021 FINDINGS: Brain: No evidence of acute infarction, hemorrhage, hydrocephalus, extra-axial collection or mass lesion/mass effect. Extensive periventricular and deep white matter hypodensity with chronic lacunar infarctions of the bilateral corona radiata (series 2, image 19). Known left  temporal metastases are not well appreciated by noncontrast CT. Vascular: No hyperdense vessel or unexpected calcification. Skull: Redemonstrated large partially calcified lytic lesion of the right parietal vertex (series 3, image 26) as well as a smaller lytic lesion involving the inner table of the more anterior right parietal lobe (series 3, image 22). Negative for fracture or focal lesion. Sinuses/Orbits: No acute finding. Other: None. IMPRESSION: 1. No acute intracranial pathology. 2. Advanced small-vessel white matter disease with chronic lacunar infarctions  of the bilateral corona radiata. 3. Known left temporal metastases seen on prior contrast enhanced MR are not well appreciated by noncontrast CT. 4. Redemonstrated large partially calcified lytic lesion of the right parietal vertex as well as a smaller lytic lesion involving the inner table of the more anterior right parietal lobe. Electronically Signed   By: Eddie Candle M.D.   On: 07/12/2021 15:22   MR BRAIN WO CONTRAST  Result Date: 07/12/2021 CLINICAL DATA:  Lung cancer. Metastatic disease. New onset right-sided weakness. EXAM: MRI HEAD WITHOUT CONTRAST TECHNIQUE: Multiplanar, multiecho pulse sequences of the brain and surrounding structures were obtained without intravenous contrast. COMPARISON:  Eighty crest pattern radiology area on malignant this FINDINGS: Brain: T2 and FLAIR signal changes are again noted in the previously identified metastatic brain lesions. The largest is in the inferior left temporal lobe measuring 17 mm. Additional lesions involving the left thalamus, posterior left temporal white matter in left frontal lobe are again noted. T2 signal changes associated with a lesion in the peripheral right cerebellum noted. Multiple remote lacunar infarcts are present in the periventricular white matter, right lentiform nucleus, right pons, and bilateral cerebellum without significant interval change. No acute infarct is present.  No  acute hemorrhage is present. The internal auditory canals are within normal limits. Vascular: Flow is present in the major intracranial arteries. Skull and upper cervical spine: Metastatic lesions to the skull are stable. The largest lesion is in the right occipital skull, measuring over 6.5 cm in maximal dimension. Smaller lesion just anterior in the right parietal skull measures up to 17 mm. Multiple other smaller lesions are again seen. See MRI of the cervical spine same day for description of metastatic disease in the cervical spine. Craniocervical junction is within normal limits. Sinuses/Orbits: Right mastoid effusion is again noted. The paranasal sinuses and mastoid air cells are otherwise clear. The globes and orbits are within normal limits. IMPRESSION: 1. Stable appearance of metastatic brain lesions. 2. Metastatic lesions to the skull are stable. 3. No acute intracranial abnormality to explain the new weakness. 4. Multiple remote lacunar infarcts involving the periventricular white matter, right lentiform nucleus, right pons, and bilateral cerebellum are stable. 5. Right mastoid effusion. Electronically Signed   By: San Morelle M.D.   On: 07/12/2021 18:08   MR BRAIN W WO CONTRAST  Addendum Date: 07/02/2021   ADDENDUM REPORT: 07/02/2021 20:17 ADDENDUM: Omitted finding of multiple subcutaneous nodules in the scalp, likely related to history of neurofibromatosis. Electronically Signed   By: Monte Fantasia M.D.   On: 07/02/2021 20:17   Result Date: 07/02/2021 CLINICAL DATA:  Follow-up brain lesion by head CT. EXAM: MRI HEAD WITHOUT AND WITH CONTRAST TECHNIQUE: Multiplanar, multiecho pulse sequences of the brain and surrounding structures were obtained without and with intravenous contrast. CONTRAST:  77mL GADAVIST GADOBUTROL 1 MMOL/ML IV SOLN COMPARISON:  Head CT from 06/19/2021 FINDINGS: Brain: Positive for intracranial metastases: *21 mm metastasis in the inferior left temporal lobe. *5 mm  ring-enhancing metastasis in the left medial thalamus *3 mm ring-enhancing metastasis in the posterior left temporal white matter, see sagittal postcontrast images *Suspected 4 mm ring-enhancing metastasis in the deep left frontal white matter, see sagittal postcontrast images and axial diffusion images. *Notable subcentimeter nodular focus of weakly restricted diffusion in the right occipital white matter on axial images, not seen on postcontrast imaging. Chronic small vessel ischemia with gliosis and multiple chronic lacunar infarcts in the brainstem, deep gray nuclei, and deep white matter. No acute hemorrhage or  hydrocephalus. Vascular: Preserved flow voids and vascular enhancements. Skull and upper cervical spine: Transcortical lytic lesion in the right parietal bone measuring 5.2 cm in diameter. There is contact of the superior sagittal sinus without visible invasion. More anterior 18 mm parietal bone metastasis which erodes the inner cortex and is indistinguishable from the adjacent dura. Small daughter lesions extend inferiorly from the largest right parietal bone metastasis. Sinuses/Orbits: Partial right mastoid opacification with negative nasopharynx. IMPRESSION: 1. At least 3 brain metastases measuring up to 21 mm in the inferior left temporal lobe. There may be 2 additional subcentimeter metastases based on diffusion imaging. 2. Lytic lesions in the right parietal bone extending to the dura. 3. Chronic small vessel disease with chronic lacunes. Electronically Signed: By: Monte Fantasia M.D. On: 07/02/2021 19:39   MR Cervical Spine W or Wo Contrast  Result Date: 07/12/2021 CLINICAL DATA:  Metastatic disease evaluation EXAM: MRI CERVICAL SPINE WITHOUT AND WITH CONTRAST TECHNIQUE: Multiplanar and multiecho pulse sequences of the cervical spine, to include the craniocervical junction and cervicothoracic junction, were obtained without and with intravenous contrast. CONTRAST:  61mL GADAVIST GADOBUTROL 1  MMOL/ML IV SOLN COMPARISON:  06/07/2021 FINDINGS: Alignment: Reversal of the normal cervical lordosis. Trace anterolisthesis of C3 on C4. Vertebrae: Redemonstrated marrow replacing lesions in the C6 and T1 vertebral bodies, as well as the C5 and C7 vertebral bodies to a lesser extent, extending to the right pedicles and posterior elements. Again noted are bulky extraosseous soft tissue masses, which extend into the right foramina at C5-C6, C6-C7, C7-T1, and T1-T2. One total, the mass measures up to 5.9 x 3.2 x 3.6 cm (series 20, image 5 and series 16, image 27), previously 5.6 x 2.2 x 3.5 cm. The posterior aspect of the right first and second ribs are also likely involved. No pathologic fracture is seen. Cord: Normal signal and morphology. Extraosseous extension of tumor posteriorly at C6, and on the right at C7 and T1, overall unchanged. No evidence of cord compression. Posterior Fossa, vertebral arteries, paraspinal tissues: Vertebral artery flow voids are maintained, although again noted is encasement of the right vertebral artery at the level of C6, as well as now at C5. Extension of the previously noted soft tissue masses in the right paraspinal soft tissues, as described above. Redemonstrated infarct in the pons. Disc levels: C2-C3: Disc osteophyte complex. Left greater than right facet arthropathy. No spinal canal stenosis. Moderate to severe left neural foraminal narrowing. C3-C4: Trace anterolisthesis with disc unroofing. Mild left greater than right facet arthropathy. No spinal canal stenosis. No neural foraminal narrowing. C4-C5: Minimal disc bulge. Uncovertebral and facet arthropathy. No spinal canal stenosis or neural foraminal narrowing. C5-C6: Disc height loss with minimal disc bulge. Uncovertebral and facet arthropathy. No spinal canal stenosis. Tumor is again seen in the right neural foramen. No epidural extension of tumor at this level. C6-C7: Disc osteophyte. Facet and uncovertebral hypertrophy.  Tumor is again noted in the right neural foramen. No spinal canal stenosis. No epidural extension of tumor. C7-T1: Complete occlusion of the right neural foramen secondary to tumor. There is epidural extension, with mass effect on the thecal sac but no spinal canal stenosis. The left neural foramen is unremarkable. The right T1-T2 neural foramen also appears completely occluded secondary to tumor. IMPRESSION: 1. Redemonstrated marrow replacing lesions in the C6 and T1 vertebral bodies, as well as the C5 and C7 vertebral bodies to a lesser extent, primarily involving the right aspect of the vertebral bodies, pedicles, and posterior elements.  This has slightly increased in size compared to the prior exam. Epidural extension of tumor at C7-T1 and T1-T2, with mild mass effect on the thecal sac but no significant canal stenosis or cord signal abnormality. Associated extraosseous soft tissue in the right-sided foramina again noted at C5-C6, C6-C7, C7-T1, and T1-T2. 2. Redemonstrated encasement of the right vertebral artery at the C6 level, now with some encasement at C5. The flow void is maintained. Electronically Signed   By: Merilyn Baba M.D.   On: 07/12/2021 18:32   NM PET Image Initial (PI) Skull Base To Thigh  Result Date: 07/08/2021 CLINICAL DATA:  Initial treatment strategy for lung nodule. EXAM: NUCLEAR MEDICINE PET SKULL BASE TO THIGH TECHNIQUE: 5.6 mCi F-18 FDG was injected intravenously. Full-ring PET imaging was performed from the skull base to thigh after the radiotracer. CT data was obtained and used for attenuation correction and anatomic localization. Fasting blood glucose: 170 mg/dl COMPARISON:  Chest CT 07/02/2021 and CT abdomen/pelvis from 06/09/2021 FINDINGS: Mediastinal blood pool activity: SUV max 3.0 Liver activity: SUV max 3.6 NECK: The left temporal lobe lesion is faintly seen on the top most CT image (image 1, series 4) but not well appreciated on the PET images. Likewise the calvarial  lesions are not included. The destructive spinal lesions are discussed under the skeletal section below. Incidental CT findings: Right mastoid effusion. CHEST: The confluent triangular left upper lobe mass measures 3.9 by 3.2 cm on image 36 series 8 with maximum SUV 8.2, compatible with malignancy. AP window lymph node 0.8 cm in short axis on image 57 series 4, maximum SUV 3.3. Scattered sessile and exophytic cutaneous nodules are identified along the neck, chest, and abdomen, a representative exophytic presternal lesion measuring 1.3 by 1.1 cm has a maximum SUV of 2.7. These are probably neurofibromas. Incidental CT findings: Biapical pleuroparenchymal scarring. Mild paraseptal emphysema. Mild scarring in the left lower lobe. The subtle clustered nodularity in the right upper lobe is roughly stable and not appreciably hypermetabolic. ABDOMEN/PELVIS: 2.4 by 2.8 cm left adrenal mass on image 100 series 4, maximum SUV 3.9. No fatty elements in this mass, precontrast density is 27 Hounsfield units. This is likely a metastatic lesion although has substantially less metabolic activity than the primary tumor. Scattered accentuated segmental metabolic activity in bowel, likely physiologic. Incidental CT findings: Potential wall thickening in the nondistended cecum, ascending colon, transverse colon, and proximal descending colon-colitis not excluded. Scattered colonic diverticula. Aortoiliac atherosclerotic vascular disease. Diverticulum of the proximal transverse duodenum. SKELETON: Hypermetabolic destructive mass of the right C6 vertebral body as depicted on recent examinations, with involvement of the right posterior elements, maximum SUV 6.4, compatible with metastatic lesion. Expansile destructive lesion of the right T1 vertebral body with extension into the right neural foramen at C7-T1, involvement of right C7 and T1 posterior elements, and involvement of the right T1 transverse process with some posterior erosion  of the right first rib, maximum SUV 12.7, compatible with metastatic lesion. No other hypermetabolic osseous metastatic lesions are identified. Incidental CT findings: none IMPRESSION: 1. Hypermetabolic left upper lobe mass with maximum SUV 8.2, with associated destructive hypermetabolic metastatic lesions involving the right C6, C7, and T1 vertebra as depicted on recent examinations. The left adrenal mass is only mildly hypermetabolic with maximum SUV of 3.9 but still remains suspicious for a metastatic lesion. 2. The calvarial and intracranial lesions are not well depicted on today's PET-CT. 3. Potential wall thickening in the nondistended cecum, ascending colon, transverse colon, and descending colon-colitis  is not excluded. 4. No substantial accentuated metabolic activity in the subtle clustered nodularity in the right upper lobe which is probably inflammatory but merit surveillance. 5. 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 6. Other imaging findings of potential clinical significance: Right mastoid effusion. Mild paraseptal emphysema. Aortic Atherosclerosis (ICD10-I70.0) and Emphysema (ICD10-J43.9). Electronically Signed   By: Van Clines M.D.   On: 07/08/2021 10:58   DG CHEST PORT 1 VIEW  Result Date: 07/06/2021 CLINICAL DATA:  Post bronchoscopy. EXAM: PORTABLE CHEST 1 VIEW COMPARISON:  CT chest dated July 02, 2021. FINDINGS: The heart size and mediastinal contours are within normal limits. Grossly unchanged left upper lobe mass. Mild right basilar atelectasis. No pleural effusion or pneumothorax. No acute osseous abnormality. IMPRESSION: 1. No pneumothorax status post bronchoscopy. 2. Grossly unchanged left upper lobe mass. Electronically Signed   By: Titus Dubin M.D.   On: 07/06/2021 09:18   CT Super D Chest Wo Contrast  Result Date: 07/05/2021 CLINICAL DATA:  Metastatic lung cancer  EXAM: CT CHEST WITHOUT CONTRAST TECHNIQUE: Multidetector CT imaging of the chest was performed using thin slice collimation for electromagnetic bronchoscopy planning purposes, without intravenous contrast. COMPARISON:  CT chest dated 06/09/2021 FINDINGS: Cardiovascular: The heart is top-normal in size. No pericardial effusion. No evidence of thoracic aortic aneurysm. Atherosclerotic calcifications of the aortic arch. Mild coronary atherosclerosis of the LAD and left circumflex. Mediastinum/Nodes: 8 mm short axis subcarinal node, within normal limits. Visualized thyroid is unremarkable. Lungs/Pleura: 4.0 x 3.2 cm aggregate lesion in the left upper lobe (series 3/images 63 and 65), previously 3.5 x 3.0 cm when measured in a similar fashion. Adjacent 13 mm central left upper lobe nodule (series 3/image 62), previously 12 mm. This corresponds to the patient's known primary bronchogenic neoplasm. Biapical pleural-parenchymal scarring. Faint peribronchovascular ground-glass and solid nodules in the right upper lobe (series 3/images 49 and 58), favoring infection/inflammation over metastatic disease. Mild subpleural scarring with volume loss in the left lower lobe. No focal consolidation. Mild paraseptal emphysematous changes, upper lung predominant. No pleural effusion or pneumothorax. Upper Abdomen: 2.5 x 2.5 cm left adrenal metastasis, incompletely visualized, previously 2.0 x 2.0 cm. Musculoskeletal: Destructive lytic metastasis involving the right T1 vertebral body and transverse process (series 2/image 1), incompletely visualized, similar. IMPRESSION: 4.0 cm aggregate left upper lobe mass with adjacent 13 mm central left upper lobe nodule, corresponding to the patient's known primary bronchogenic neoplasm, mildly progressive. Faint peribronchovascular ground-glass and solid nodules in the right upper lobe, favoring infection/inflammation over metastatic disease. 2.5 cm left adrenal metastasis, incompletely  visualized, progressive. Destructive lytic metastasis involving the right T1 vertebral body and transverse process, incompletely visualized, similar. Aortic Atherosclerosis (ICD10-I70.0) and Emphysema (ICD10-J43.9). Electronically Signed   By: Julian Hy M.D.   On: 07/05/2021 03:13   DG C-ARM BRONCHOSCOPY  Result Date: 07/06/2021 C-ARM BRONCHOSCOPY: Fluoroscopy was utilized by the requesting physician.  No radiographic interpretation.      IMPRESSION/PLAN:   Today, I talked to the patient and her daughter about the findings and work-up thus far.  We discussed the patient's diagnosis of metastatic cancer to bone and now known disease in the brain as well, and reviewed her recent pathology results which demonstrate likely small cell lung cancer.  The patient understands that she will eventually need chemotherapy and I personally reviewed her case with medical oncology.  We discussed the value of starting with radiation therapy to the whole brain and the cervical metastases  with palliative intent.  Chemotherapy would then be given subsequently for systemic treatment.  She understands that treatments would involve 10 fractions to the spine and 14 fractions to the brain. We discussed the available radiation techniques, and focused on the details of logistics and delivery.     She has already been consented for treatment to the spine and today I consented her for treatment to the whole brain.  We discussed side effects which may include but not necessarily be limited to fatigue, skin irritation of the scalp, hair loss, and possible injury to the brain or neurologic decline.  She is enthusiastic to proceed and consent form was signed today.  We will proceed with CT simulation to the cervical spine and brain at her next available appointment which is August 26 and start her treatment quickly, turning around the plan so she can commence on August 29 immediately after the weekend.  Pt, family, pleased with  the plan above.   On date of service, in total, I spent 30 minutes on this encounter. Patient was seen in person.   __________________________________________   Eppie Gibson, MD  This document serves as a record of services personally performed by Eppie Gibson, MD. It was created on her behalf by Roney Mans, a trained medical scribe. The creation of this record is based on the scribe's personal observations and the provider's statements to them. This document has been checked and approved by the attending provider.

## 2021-07-13 NOTE — Evaluation (Addendum)
Physical Therapy Evaluation Patient Details Name: Lynn Wiley MRN: [id number] DOB: August 08, 1956 Today's Date: 07/13/2021   History of Present Illness  65 y.o. female admitted with weakness. Imaging showed brain mets, no new CVA. Weakness thought to be 2* mass effect from cervical lesions.  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  Clinical Impression  Pt admitted with above diagnosis. Min assist for bed mobility. Min assist of 2 for stand pivot transfer. Pt fatigues quickly with minimal activity. I instructed pt/daughter in BLE strengthening exercises to be done independently to minimize deconditioning during hospitalization. Daughter present and stated she feels she can manage her mom's care at home with a WC. She plans to have a ramp built. If ramp isn't in place at time of DC, pt would likely need PTAR assist for entering home. Pt had 2 falls in the week prior to admission so is a high fall risk.  Pt currently with functional limitations due to the deficits listed below (see PT Problem List). Pt will benefit from skilled PT to increase their independence and safety with mobility to allow discharge to the venue listed below.       Follow Up Recommendations Home health PT;Supervision for mobility/OOB    Equipment Recommendations  Wheelchair cushion (measurements PT);Wheelchair (measurements PT)    Recommendations for Other Services       Precautions / Restrictions Precautions Precautions: Fall Precaution Comments: 2 falls in past 1 week prior to admission Restrictions Weight Bearing Restrictions: No      Mobility  Bed Mobility Overal bed mobility: Needs Assistance Bed Mobility: Supine to Sit     Supine to sit: Min assist;HOB elevated     General bed mobility comments: min A to raise trunk    Transfers Overall transfer level: Needs assistance Equipment used: 2 person hand held  assist Transfers: Sit to/from [facility] Sit to Stand: Min assist;+2 physical assistance Stand pivot transfers: Min assist;+2 physical assistance       General transfer comment: min A to power up and for balance during pivot from bed to recliner  Ambulation/Gait             General Gait Details: unable, fatigued from SPT  Stairs            Wheelchair Mobility    Modified Rankin (Stroke Patients Only)       Balance Overall balance assessment: History of Falls;Needs assistance Sitting-balance support: No upper extremity supported;Feet unsupported Sitting balance-Leahy Scale: Poor Sitting balance - Comments: posterior lean when unsupported   Standing balance support: Bilateral upper extremity supported Standing balance-Leahy Scale: Poor Standing balance comment: relies on BUE support                             Pertinent Vitals/Pain Pain Assessment: 0-10 Pain Score: 5  Pain Location: R shoulder blade with UE activity Pain Descriptors / Indicators: Sore Pain Intervention(s): Limited activity within patient's tolerance;Monitored during session;Patient requesting pain meds-RN notified;Heat applied    Home Living Family/patient expects to be discharged to:: Private residence Living Arrangements: Children Available Help at Discharge: Family;Available 24 hours/day Type of Home: House Home Access: Stairs to enter   [employer] of Steps: 3 Home Layout: One level Home Equipment: Bedside commode;Shower seat Additional Comments: family ordered rollator, it hasn't arrived yet. They requested WC, they plan to have a ramp built  Prior Function Level of Independence: Needs assistance   Gait / Transfers Assistance Needed: walking short distances with B hand held assist, has had 2 falls in past 1 week  ADL's / Homemaking Assistance Needed: assist for sponge bath        Hand Dominance        Extremity/Trunk Assessment    Upper Extremity Assessment Upper Extremity Assessment: Defer to OT evaluation    Lower Extremity Assessment Lower Extremity Assessment: Generalized weakness (hip flexion +3/5, knee ext -4/5, ankles -4/5; sensation intact to light touch BLEs)    Cervical / Trunk Assessment Cervical / Trunk Assessment: Normal  Communication   Communication: No difficulties  Cognition Arousal/Alertness: Awake/alert Behavior During Therapy: WFL for tasks assessed/performed Overall Cognitive Status: Within Functional Limits for tasks assessed                                        General Comments      Exercises Ankle pumps both x 10 AROM supine Quad sets x 5 both AROM supine Heel slides x 5 both AAROM supine   Assessment/Plan    PT Assessment Patient needs continued PT services  PT Problem List Decreased activity tolerance;Decreased balance;Decreased mobility;Pain       PT Treatment Interventions Gait training;Therapeutic activities;Functional mobility training;Balance training;Patient/family education    PT Goals (Current goals can be found in the Care Plan section)  Acute Rehab PT Goals Patient Stated Goal: to get stronger PT Goal Formulation: With patient/family Time For Goal Achievement: 07/27/21 Potential to Achieve Goals: Fair    Frequency Min 3X/week   Barriers to discharge        Co-evaluation PT/OT/SLP Co-Evaluation/Treatment: Yes Reason for Co-Treatment: To address functional/ADL transfers PT goals addressed during session: Mobility/safety with mobility;Balance;Strengthening/ROM         AM-PAC PT "6 Clicks" Mobility  Outcome Measure Help needed turning from your back to your side while in a flat bed without using bedrails?: A Little Help needed moving from lying on your back to sitting on the side of a flat bed without using bedrails?: A Little Help needed moving to and from a bed to a chair (including a wheelchair)?: A Lot Help needed standing up  from a chair using your arms (e.g., wheelchair or bedside chair)?: A Lot Help needed to walk in hospital room?: Total Help needed climbing 3-5 steps with a railing? : Total 6 Click Score: 12    End of Session   Activity Tolerance: Patient limited by fatigue Patient left: in chair;with call bell/phone within reach;with family/visitor present;with chair alarm set Nurse Communication: Mobility status PT Visit Diagnosis: Difficulty in walking, not elsewhere classified (R26.2);Pain;History of falling (Z91.81) Pain - Right/Left: Right Pain - part of body: Shoulder    Time: 2563-8937 PT Time Calculation (min) (ACUTE ONLY): 27 min   Charges:   PT Evaluation $PT Eval Moderate Complexity: 1 Mod        Philomena Doheny PT 07/13/2021  Acute Rehabilitation Services Pager [phone] Office [phone]

## 2021-07-13 NOTE — Evaluation (Signed)
Occupational Therapy Evaluation Patient Details Name: Lynn Wiley MRN: [id number] DOB: [birth year] Today's Date: 07/13/2021    History of Present Illness 65 y.o. female admitted with weakness. Imaging showed brain mets, no new CVA. Weakness thought to be 2* mass effect from cervical lesions.  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   Clinical Impression   Pt admitted with the above diagnoses and presents with below problem list. Pt will benefit from continued acute OT to address the below listed deficits and maximize independence with basic ADLs prior to d/c home. PTA pt needed min A with bathing, dressing, functional transfers and short distance mobility. setup with meals. Pt currently needs up to mod A with LB ADLs and stand-pivot transfers. Pt's daughter is her primary caregiver and present throughout session. Pt up in recliner at end of session.      Follow Up Recommendations  Home health OT;Supervision - Intermittent (mobility/OOB)    Equipment Recommendations  Wheelchair (measurements OT);Wheelchair cushion (measurements OT)    Recommendations for Other Services       Precautions / Restrictions Precautions Precautions: Fall Precaution Comments: 2 falls in past 1 week prior to admission Restrictions Weight Bearing Restrictions: No      Mobility Bed Mobility Overal bed mobility: Needs Assistance Bed Mobility: Supine to Sit     Supine to sit: Min assist;HOB elevated     General bed mobility comments: min A to raise trunk    Transfers Overall transfer level: Needs assistance Equipment used: 2 person hand held assist Transfers: Sit to/from [facility] Sit to Stand: Min assist;+2 physical assistance Stand pivot transfers: Min assist;+2 physical assistance       General transfer comment: min A to power up and for balance during pivot from bed to recliner     Balance Overall balance assessment: History of Falls;Needs assistance Sitting-balance support: No upper extremity supported;Feet unsupported Sitting balance-Leahy Scale: Poor Sitting balance - Comments: posterior lean when unsupported   Standing balance support: Bilateral upper extremity supported Standing balance-Leahy Scale: Poor Standing balance comment: relies on BUE support                           ADL either performed or assessed with clinical judgement   ADL Overall ADL's : Needs assistance/impaired Eating/Feeding: Set up;Sitting Eating/Feeding Details (indicate cue type and reason): extra time/effort Grooming: Moderate assistance   Upper Body Bathing: Moderate assistance   Lower Body Bathing: Moderate assistance;+2 for safety/equipment;Sit to/from stand;Sitting/lateral leans   Upper Body Dressing : Moderate assistance;Sitting   Lower Body Dressing: Moderate assistance;Maximal assistance;Sitting/lateral leans;Sit to/from stand;+2 for safety/equipment   Toilet Transfer: Minimal assistance;Stand-pivot;+2 for physical assistance;BSC Toilet Transfer Details (indicate cue type and reason): +2 HHA min A/mod A +1 HHA Toileting- Clothing Manipulation and Hygiene: Maximal assistance;Sit to/from stand;Sitting/lateral lean         General ADL Comments: Pt completed bed mobility UB dressing, pericare and pivotal steps during session. Pt fatigued with minimal activity but able to continue to complete presented tasks.     Vision         Perception     Praxis      Pertinent Vitals/Pain Pain Assessment: 0-10 Pain Score: 5  Pain Location: R shoulder blade with UE activity Pain Descriptors / Indicators: Sore Pain Intervention(s): Limited activity within patient's tolerance;Monitored during session;Repositioned;Patient requesting pain meds-RN notified;Heat applied  Hand Dominance     Extremity/Trunk Assessment Upper Extremity Assessment Upper Extremity  Assessment: Generalized weakness;RUE deficits/detail;LUE deficits/detail RUE Deficits / Details: pain with AROM >45*. fine and gross motor coordination impaired. sensation appears intact. tends to hold RUE in elbow flexed MCP flexion, forearm supination. does not uses BUE for bed mobility. Weakness noted. grossly 3/5 strength. RUE Sensation: WNL RUE Coordination: decreased fine motor;decreased gross motor LUE Deficits / Details: fine and gross motor coordination impaired. sensation appears intact. tends to hold LUE in elbow flexed MCP flexion, forearm supination. does not uses BUE for bed mobility. Weakness noted. grossly 3/5 strength. LUE Sensation: WNL LUE Coordination: decreased fine motor;decreased gross motor   Lower Extremity Assessment Lower Extremity Assessment: Defer to PT evaluation   Cervical / Trunk Assessment Cervical / Trunk Assessment: Normal   Communication Communication Communication: No difficulties   Cognition Arousal/Alertness: Awake/alert Behavior During Therapy: WFL for tasks assessed/performed Overall Cognitive Status: Within Functional Limits for tasks assessed                                     General Comments  Daughter present throughout session    Exercises  Other Exercises: discussed AROM exercises for BUE   Shoulder Instructions      Home Living Family/patient expects to be discharged to:: Private residence Living Arrangements: Children Available Help at Discharge: Family;Available 24 hours/day Type of Home: House Home Access: Stairs to enter [employer] of Steps: 3 Entrance Stairs-Rails: None Home Layout: One level           Bathroom Accessibility: Yes   Home Equipment: Bedside commode;Shower seat   Additional Comments: family ordered rollator, it hasn't arrived yet. They requested WC, they plan to have a ramp built      Prior Functioning/Environment Level of Independence: Needs assistance  Gait /  Transfers Assistance Needed: walking short distances with B hand held assist, has had 2 falls in past 1 week ADL's / Homemaking Assistance Needed: assist for sponge bath and dressing. setup with meals (cutting up food, etc). extra time/mod I with feeding tasks            OT Problem List: Decreased strength;Decreased activity tolerance;Impaired balance (sitting and/or standing);Decreased knowledge of use of DME or AE;Decreased knowledge of precautions;Cardiopulmonary status limiting activity;Impaired UE functional use;Pain      OT Treatment/Interventions: Self-care/ADL training;Therapeutic exercise;Energy conservation;DME and/or AE instruction;Therapeutic activities;Patient/family education;Balance training    OT Goals(Current goals can be found in the care plan section) Acute Rehab OT Goals Patient Stated Goal: to get stronger, return home OT Goal Formulation: With patient/family Time For Goal Achievement: 07/27/21 Potential to Achieve Goals: Good ADL Goals Pt Will Perform Upper Body Dressing: with min assist;sitting Pt Will Perform Lower Body Dressing: with min assist;sit to/from stand;sitting/lateral leans Pt Will Transfer to Toilet: with min assist;bedside commode;stand pivot transfer;ambulating Pt Will Perform Toileting - Clothing Manipulation and hygiene: with min assist;sitting/lateral leans;sit to/from stand Additional ADL Goal #1: Pt will complete bed mobility at min A level to prepare for EOB/OOB ADLs.  OT Frequency: Min 2X/week   Barriers to D/C:            Co-evaluation PT/OT/SLP Co-Evaluation/Treatment: Yes Reason for Co-Treatment: For patient/therapist safety PT goals addressed during session: Mobility/safety with mobility;Balance;Strengthening/ROM OT goals addressed during session: ADL's and self-care;Proper use of Adaptive equipment and DME      AM-PAC OT "6 Clicks" Daily Activity  Outcome Measure Help from another person eating meals?: None Help from  another person taking care of personal grooming?: A Little Help from another person toileting, which includes using toliet, bedpan, or urinal?: A Little Help from another person bathing (including washing, rinsing, drying)?: A Little Help from another person to put on and taking off regular upper body clothing?: A Little Help from another person to put on and taking off regular lower body clothing?: A Little 6 Click Score: 19   End of Session Nurse Communication: Mobility status  Activity Tolerance: Patient limited by fatigue Patient left: in chair;with call bell/phone within reach;with family/visitor present;with chair alarm set  OT Visit Diagnosis: Unsteadiness on feet (R26.81);Muscle weakness (generalized) (M62.81);Pain;Other symptoms and signs involving the nervous system (R29.898)                Time: 1712-7871 OT Time Calculation (min): 24 min Charges:  OT General Charges $OT Visit: 1 Visit OT Evaluation $OT Eval Moderate Complexity: [place], OT Acute Rehabilitation Services Pager: [phone] Office: [phone]   Hortencia Pilar 07/13/2021, 1:39 PM

## 2021-07-13 NOTE — Plan of Care (Signed)
  Problem: Clinical Measurements: Goal: Diagnostic test results will improve Outcome: Progressing   

## 2021-07-13 NOTE — Hospital Course (Addendum)
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  Secondary to cervical metastasis.  Radiation oncologist recommended patient proceed to the emergency department where she was admitted for radiation oncology evaluation and treatment.  Cervical spine metastatic disease with associated right hand weakness and numbness.  Secondary to metastatic lung cancer. --Radiation oncology and oncology consultations.  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 --Steroids.   Essential hypertension -- Stable  Hyperglycemia, hemoglobin A1c 8, may be drug-induced secondary to prolonged prednisone.   -- Sliding scale insulin.  If we will continue on steroids, would proceed with diabetic teaching and treatment.   Metastatic lung cancer -- As per pulmonology.    TSH low --asymptomatic, check T4   Neurofibromatosis  --stable

## 2021-07-13 NOTE — Progress Notes (Signed)
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  Secondary to cervical metastasis.  Radiation oncologist recommended patient proceed to the emergency department where she was admitted for radiation oncology evaluation and treatment.  A & P  Cervical spine metastatic disease with associated right hand weakness and numbness.  Secondary to metastatic lung cancer. --Radiation oncology and oncology consultations (I discussed with K. Curcio and Dr. Sondra Come.  Likely XRT. --Steroids.   Essential hypertension -- Stable  Hyperglycemia, hemoglobin A1c 8, may be drug-induced secondary to prolonged prednisone.   -- Sliding scale insulin.  If will continue on steroids, would proceed with diabetic teaching and treatment.   Metastatic lung cancer -- As per pulmonology.    TSH low --asymptomatic, check T4   Neurofibromatosis  --stable  Skin Assessment: I agree with the wound assessment as performed by the wound care RN as outlined below: Pressure Injury 07/13/21 Buttocks Medial Stage 2 -  Partial thickness loss of dermis presenting as a shallow open injury with a red, pink wound bed without slough. pink read (Active)  07/13/21 0030  Location: Buttocks  Location Orientation: Medial  Staging: Stage 2 -  Partial thickness loss of dermis presenting as a shallow open injury with a red, pink wound bed without slough.  Wound Description (Comments): pink read  Present on Admission: Yes     Pressure Injury 07/13/21 Buttocks (Active)  07/13/21 0050  Location: Buttocks  Location Orientation:   Staging:   Wound Description (Comments):   Present on Admission:    Disposition Plan:  Discussion:   Status is: Inpatient  Remains inpatient  appropriate because:IV treatments appropriate due to intensity of illness or inability to take PO and Inpatient level of care appropriate due to severity of illness  Dispo: The patient is from: Home              Anticipated d/c is to: Home              Patient currently is not medically stable to d/c.   Difficult to place patient No  DVT prophylaxis: SCDs Start: 07/13/21 0005   Code Status: Full Code Level of care: Telemetry Family Communication: 2 daughters at bedside  Murray Hodgkins, MD  [facility] Direct contact: see [email or web address] (further directions at bottom of note if needed) 7PM-7AM contact night coverage as at bottom of note 07/13/2021, 3:47 PM  LOS: 1 day   Significant Hospital Events   8/22 admit for cervical spine mets XRT   Consults:  Oncology Radiation oncology    Procedures:    Significant Diagnostic Tests:  MR brain stable metastatic brain lesions, metastatic skull lesions, no acute intracranial abnormality, multiple remote lacunar infarcts. MR spine with metastatic lesions C6, T1, C5, C7, epidural extension of tumor at C7-T11 and T1-T2 with mild mass-effect on thecal sac but no canal stenosis or cord signal abnormality.   Micro Data:  None    Antimicrobials:  None   Interval History/Subjective  CC: f/u right hand numbness  Feels about the same today, still has right hand numbness and decreased strength.  No leg involvement.  No bowel or bladder problems.  Objective   Vitals:  Vitals:   07/13/21 0724 07/13/21 1207  BP: (!) 171/79 (!) 150/81  Pulse: 64 65  Resp: 14 16  Temp: 98.6 F (37 C) 98.5 F (36.9 C)  SpO2: 97% 98%    Exam: Physical Exam Vitals reviewed.  Constitutional:      Appearance: Normal appearance.  Cardiovascular:     Rate and Rhythm: Normal rate and regular rhythm.     Heart sounds: No murmur heard. Pulmonary:     Effort: Pulmonary effort is normal.     Breath sounds: Normal breath sounds. No wheezing.   Neurological:     Mental Status: She is alert.     Comments: Right hand grip strength weak, especially compared to left.  Moves all extremities.  Psychiatric:        Mood and Affect: Mood normal.        Behavior: Behavior normal.    I have personally reviewed the labs and other data, making special note of:   Today's Data  CBG stable, CMP unremarkable Platelets now low at 118  Hemoglobin A1c at 8   Scheduled Meds:  amLODipine  10 mg Oral BID   aspirin EC  81 mg Oral Daily   carvedilol  12.5 mg Oral BID   dexamethasone (DECADRON) injection  4 mg Intravenous Q8H   insulin aspart  0-5 Units Subcutaneous QHS   insulin aspart  0-9 Units Subcutaneous TID WC   pantoprazole  40 mg Oral Daily   pravastatin  40 mg Oral QHS   senna-docusate  2 tablet Oral Daily   sodium chloride flush  3 mL Intravenous Q12H   Continuous Infusions:  sodium chloride      Active Problems:   Neurofibromatosis (HCC)   Bone metastases (HCC)   HTN (hypertension)   Hyperglycemia, drug-induced   Lung cancer (HCC)   Pressure injury of skin   Small cell lung cancer (HCC)   Low serum thyroid stimulating hormone (TSH)   LOS: 1 day   How to contact the [facility] Attending or Consulting provider 7A - 7P or covering provider during after hours [place], for this patient?  Check the care team in [facility] and look for a) attending/consulting TRH provider listed and b) the [facility] team listed Log into [email or web address] and use [facility]'s universal password to access. If you do not have the password, please contact the hospital operator. Locate the [facility] provider you are looking for under [facility] and page to a number that you can be directly reached. If you still have difficulty reaching the provider, please page the [facility] (Director on Call) for the Hospitalists listed on amion for assistance.

## 2021-07-13 NOTE — TOC Initial Note (Addendum)
Transition of Care [facility]) - Initial/Assessment Note    Patient Details  Name: Lynn Wiley MRN: [id number] Date of Birth: 1956/08/04  [facility] CM/SW Contact:    Lynn Mage, LCSW Phone Number: 07/13/2021, 3:02 PM  Clinical Narrative:   Patient seen in follow up to PT/OT recommendation of [facility] PT/OT. [name] was lethargic, had no energy for conversing.  However, her caregiver daughter was in room, and we spoke.  Patient is already set up with [facility] services. Daughter was given possible resources for wheelchair ramp. She also asked for help obtaining a transfer chair. She will need PTAR ride home at d/c. TOC will continue to follow during the course of hospitalization.                 Expected Discharge Plan: [place] Barriers to Discharge: No Barriers Identified   Patient Goals and CMS Choice        Expected Discharge Plan and Services Expected Discharge Plan: [place]   Discharge Planning Services: CM Consult   Living arrangements for the past 2 months: Single Family Home                                      Prior Living Arrangements/Services Living arrangements for the past 2 months: Single Family Home Lives with:: Adult Children Patient language and need for interpreter reviewed:: Yes        Need for Family Participation in Patient Care: Yes (Comment) Care giver support system in place?: Yes (comment) Current home services: DME, Home PT Criminal Activity/Legal Involvement Pertinent to Current Situation/Hospitalization: No - Comment as needed  Activities of Daily Living Home Assistive Devices/Equipment: Other (Comment) (section plate) ADL Screening (condition at time of admission) Patient's cognitive ability adequate to safely complete daily activities?: Yes Is the patient deaf or have difficulty hearing?: No Does the patient have difficulty seeing, even when wearing glasses/contacts?: No Does the  patient have difficulty concentrating, remembering, or making decisions?: Yes Patient able to express need for assistance with ADLs?: Yes Does the patient have difficulty dressing or bathing?: Yes Independently performs ADLs?: No Communication: Independent Dressing (OT): Needs assistance Is this a change from baseline?: Pre-admission baseline Grooming: Independent Feeding: Independent Bathing: Needs assistance Is this a change from baseline?: Pre-admission baseline Toileting: Needs assistance Is this a change from baseline?: Pre-admission baseline In/Out Bed: Needs assistance Is this a change from baseline?: Pre-admission baseline Walks in Home: Needs assistance Is this a change from baseline?: Pre-admission baseline Does the patient have difficulty walking or climbing stairs?: Yes Weakness of Legs: Both Weakness of Arms/Hands: Both (fingers are weak)  Permission Sought/Granted      Share Information with NAME: Lynn Wiley (Daughter)   [phone]           Emotional Assessment Appearance:: Appears stated age Attitude/Demeanor/Rapport: Lethargic Affect (typically observed): Constricted Orientation: : Oriented to Self, Oriented to Situation, Oriented to Place Alcohol / Substance Use: Not Applicable Psych Involvement: No (comment)  Admission diagnosis:  Weakness [R53.1] Right leg weakness [R29.898] Patient Active Problem List   Diagnosis Date Noted   Hyperglycemia, drug-induced 07/13/2021   Lung cancer ([place]) 07/13/2021   Pressure injury of skin 07/13/2021   Small cell lung cancer ([place])    Right leg weakness 07/12/2021   HTN (hypertension) 06/23/2021   Stroke ([place]) 06/23/2021   Bone metastases ([place]) 06/19/2021  Family history of breast cancer 06/17/2021   Family history of neurofibromatosis 06/17/2021   Family history of kidney cancer 06/17/2021   Neurofibromatosis ([place]) 06/17/2021   Mass of upper lobe of left lung 06/15/2021   PCP:  Lynn Board,  NP Pharmacy:   [facility] [place] [place] [address] [place] [address] Phone: [phone] Fax: [phone]  [id number] - [place], [place] - [address] [place] [address] Phone: [phone] Fax: [phone]     Social Determinants of Health (SDOH) Interventions    Readmission Risk Interventions No flowsheet data found.

## 2021-07-13 NOTE — Progress Notes (Addendum)
HEMATOLOGY-ONCOLOGY PROGRESS NOTE  SUBJECTIVE: Lynn Wiley was seen in initial consultation by Dr. Julien Nordmann on 06/15/2021.  At the time of her visit, we had abnormal CT scans, but no additional work-up including PET scan, MRI, or pathology.  Since that visit, she has had a PET scan, MRI, and bronchoscopy.  She has not yet been able to follow-up with medical oncology or radiation oncology.  Her PET scan showed a hypermetabolic left upper lobe mass with associated destructive hypermetabolic metastatic lesions involving the right C6, C7, and T1 vertebra.  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  The patient developed progressive weakness and worsening right-sided weakness.  Symptoms have been present for about 8 days.  Daughter spoke with radiation oncology yesterday and they advised evaluation in the emergency department.  The patient is receiving dexamethasone.  She has been admitted to expedite radiation.  REVIEW OF SYSTEMS:   Constitutional: Denies fevers, chills Eyes: Denies blurriness of vision Ears, nose, mouth, throat, and face: Denies mucositis or sore throat Respiratory: Denies cough, dyspnea or wheezes Cardiovascular: Denies palpitation, chest discomfort Gastrointestinal:  Denies nausea, heartburn or change in bowel habits Skin: Denies abnormal skin rashes Lymphatics: Denies new lymphadenopathy or easy bruising Neurological: Right-sided weakness Behavioral/Psych: Mood is stable, no new changes  Extremities: No lower extremity edema All other systems were reviewed with the patient and are negative.  I have reviewed the past medical history, past surgical history, social history and family history with the patient and they are unchanged from previous note.   PHYSICAL  EXAMINATION: ECOG PERFORMANCE STATUS: 2 - Symptomatic, <50% confined to bed  Vitals:   07/13/21 0400 07/13/21 0724  BP:  (!) 171/79  Pulse:  64  Resp:  14  Temp:  98.6 F (37 C)  SpO2: 94% 97%   There were no vitals filed for this visit.  Intake/Output from previous day: No intake/output data recorded.  GENERAL:alert, no distress and comfortable SKIN: skin color, texture, turgor are normal, no rashes or significant lesions EYES: normal, Conjunctiva are pink and non-injected, sclera clear LUNGS: clear to auscultation and percussion with normal breathing effort HEART: regular rate & rhythm and no murmurs and no lower extremity edema ABDOMEN:abdomen soft, non-tender and normal bowel sounds NEURO: alert & oriented x 3 with fluent speech, no focal motor/sensory deficits  LABORATORY DATA:  I have reviewed the data as listed CMP Latest Ref Rng & Units 07/13/2021 07/12/2021 07/06/2021  Glucose 70 - 99 mg/dL 223(H) 282(H) 220(H)  BUN 8 - 23 mg/dL 32(H) 33(H) 31(H)  Creatinine 0.44 - 1.00 mg/dL 0.46 0.61 0.70  Sodium 135 - 145 mmol/L 140 142 143  Potassium 3.5 - 5.1 mmol/L 3.9 4.2 5.1  Chloride 98 - 111 mmol/L 108 109 109  CO2 22 - 32 mmol/L 23 25 26   Calcium 8.9 - 10.3 mg/dL 8.4(L) 8.2(L) 8.2(L)  Total Protein 6.5 - 8.1 g/dL 5.0(L) - -  Total Bilirubin 0.3 - 1.2 mg/dL 0.5 - -  Alkaline Phos 38 - 126 U/L 49 - -  AST 15 - 41 U/L 12(L) - -  ALT 0 - 44 U/L 18 - -    Lab Results  Component Value Date   WBC 11.1 (H) 07/13/2021   HGB 12.0 07/13/2021   HCT 38.8 07/13/2021   MCV 96.5 07/13/2021   PLT 118 (  L) 07/13/2021   NEUTROABS 10.3 (H) 07/13/2021    CT HEAD WO CONTRAST (5MM)  Result Date: 07/12/2021 CLINICAL DATA:  Fall, rule out hemorrhage EXAM: CT HEAD WITHOUT CONTRAST TECHNIQUE: Contiguous axial images were obtained from the base of the skull through the vertex without intravenous contrast. COMPARISON:  CT brain, 06/19/2021 MR brain, 07/02/2021 FINDINGS: Brain: No evidence of  acute infarction, hemorrhage, hydrocephalus, extra-axial collection or mass lesion/mass effect. Extensive periventricular and deep white matter hypodensity with chronic lacunar infarctions of the bilateral corona radiata (series 2, image 19). Known left temporal metastases are not well appreciated by noncontrast CT. Vascular: No hyperdense vessel or unexpected calcification. Skull: Redemonstrated large partially calcified lytic lesion of the right parietal vertex (series 3, image 26) as well as a smaller lytic lesion involving the inner table of the more anterior right parietal lobe (series 3, image 22). Negative for fracture or focal lesion. Sinuses/Orbits: No acute finding. Other: None. IMPRESSION: 1. No acute intracranial pathology. 2. Advanced small-vessel white matter disease with chronic lacunar infarctions of the bilateral corona radiata. 3. Known left temporal metastases seen on prior contrast enhanced MR are not well appreciated by noncontrast CT. 4. Redemonstrated large partially calcified lytic lesion of the right parietal vertex as well as a smaller lytic lesion involving the inner table of the more anterior right parietal lobe. Electronically Signed   By: Eddie Candle M.D.   On: 07/12/2021 15:22   CT HEAD WO CONTRAST  Result Date: 06/21/2021 CLINICAL DATA:  Parkinsonian festinating gait determined by examination R26.89 (ICD-10-CM) Hemiparesis affecting left side as late effect of stroke (HCC) I69.354 (ICD-10-CM). EXAM: CT HEAD WITHOUT CONTRAST TECHNIQUE: Contiguous axial images were obtained from the base of the skull through the vertex without intravenous contrast. COMPARISON:  None. FINDINGS: Brain: Approximately 1 cm rounded area of low attenuation in the left lateral ventricle, posterior to the temporal horn. Remote appearing infarcts in pons, bilateral cerebellar hemispheres, bilateral basal ganglia, left thalamus and bilateral corona radiata. No evidence of acute large vascular territory  infarct. No acute hemorrhage. No midline shift. No visible extra-axial fluid collection. No hydrocephalus. Suspected small lipoma near the torcula. Vascular: Calcific intracranial atherosclerosis. No hyperdense vessel identified Skull: Large (6.1 x 2.1 cm) destructive/lytic lesion involving the posterior right calvarium with associated soft tissue mass and possible intracranial/dural. Adjacent smaller lytic lesion more anteriorly, which also erodes the inner table. Sinuses/Orbits: Clear sinuses.  Unremarkable orbits. Other: Small right mastoid effusion. IMPRESSION: 1. Large 6.1 cm destructive/lytic lesion involving the posterior right calvarium with associated soft tissue mass and possible intracranial/dural extension. Adjacent smaller lytic lesion more anteriorly, which also erodes the inner table. Findings are concerning for osseous metastatic disease given findings on recent CT chest. Recommend MRI with contrast to further evaluate these lesions and to evaluate for intraparenchymal metastatic disease. 2. Remote appearing infarcts in the pons, bilateral cerebellar hemispheres, bilateral basal ganglia, left thalamus and bilateral corona radiata. The recommended MRI could provide more sensitive evaluation for acute infarct. 3. Approximately 1 cm hypodense lesion posterior to the left temporal horn, potentially a choroid fissure cyst or sequela of prior infarct. The recommended MRI can provide further evaluation. These results will be called to the ordering clinician or representative by the Radiologist Assistant, and communication documented in the PACS or [employer]. Electronically Signed   By: Margaretha Sheffield MD   On: 06/21/2021 10:52   MR BRAIN WO CONTRAST  Result Date: 07/12/2021 CLINICAL DATA:  Lung cancer. Metastatic disease. New onset right-sided  weakness. EXAM: MRI HEAD WITHOUT CONTRAST TECHNIQUE: Multiplanar, multiecho pulse sequences of the brain and surrounding structures were obtained without  intravenous contrast. COMPARISON:  Eighty crest pattern radiology area on malignant this FINDINGS: Brain: T2 and FLAIR signal changes are again noted in the previously identified metastatic brain lesions. The largest is in the inferior left temporal lobe measuring 17 mm. Additional lesions involving the left thalamus, posterior left temporal white matter in left frontal lobe are again noted. T2 signal changes associated with a lesion in the peripheral right cerebellum noted. Multiple remote lacunar infarcts are present in the periventricular white matter, right lentiform nucleus, right pons, and bilateral cerebellum without significant interval change. No acute infarct is present.  No acute hemorrhage is present. The internal auditory canals are within normal limits. Vascular: Flow is present in the major intracranial arteries. Skull and upper cervical spine: Metastatic lesions to the skull are stable. The largest lesion is in the right occipital skull, measuring over 6.5 cm in maximal dimension. Smaller lesion just anterior in the right parietal skull measures up to 17 mm. Multiple other smaller lesions are again seen. See MRI of the cervical spine same day for description of metastatic disease in the cervical spine. Craniocervical junction is within normal limits. Sinuses/Orbits: Right mastoid effusion is again noted. The paranasal sinuses and mastoid air cells are otherwise clear. The globes and orbits are within normal limits. IMPRESSION: 1. Stable appearance of metastatic brain lesions. 2. Metastatic lesions to the skull are stable. 3. No acute intracranial abnormality to explain the new weakness. 4. Multiple remote lacunar infarcts involving the periventricular white matter, right lentiform nucleus, right pons, and bilateral cerebellum are stable. 5. Right mastoid effusion. Electronically Signed   By: San Morelle M.D.   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 Electronically Signed   By: Monte Fantasia M.D.   On: 07/02/2021 20:17   Result Date: 07/02/2021 CLINICAL DATA:  Follow-up brain lesion by head CT. EXAM: MRI HEAD WITHOUT AND WITH CONTRAST TECHNIQUE: Multiplanar, multiecho pulse sequences of the brain and surrounding structures were obtained without and with intravenous contrast. CONTRAST:  70mL GADAVIST GADOBUTROL 1 MMOL/ML IV SOLN COMPARISON:  Head CT from 06/19/2021 FINDINGS: Brain: Positive for intracranial metastases: *21 mm metastasis in the inferior left temporal lobe. *5 mm ring-enhancing metastasis in the left medial thalamus *3 mm ring-enhancing metastasis in the posterior left temporal white matter, see sagittal postcontrast images *Suspected 4 mm ring-enhancing metastasis in the deep left frontal white matter, see sagittal postcontrast images and axial diffusion images. *Notable subcentimeter nodular focus of weakly restricted diffusion in the right occipital white matter on axial images, not seen on postcontrast imaging. Chronic small vessel ischemia with gliosis and multiple chronic lacunar infarcts in the brainstem, deep gray nuclei, and deep white matter. No acute hemorrhage or hydrocephalus. Vascular: Preserved flow voids and vascular enhancements. Skull and upper cervical spine: Transcortical lytic lesion in the right parietal bone measuring 5.2 cm in diameter. There is contact of the superior sagittal sinus without visible invasion. More anterior 18 mm parietal bone metastasis which erodes the inner cortex and is indistinguishable from the adjacent dura. Small daughter lesions extend inferiorly from the largest right parietal bone metastasis. Sinuses/Orbits: Partial right mastoid opacification with negative nasopharynx. IMPRESSION: 1. At least 3 brain metastases measuring up to 21  mm in the  inferior left temporal lobe. There may be 2 additional subcentimeter metastases based on diffusion imaging. 2. Lytic lesions in the right parietal bone extending to the dura. 3. Chronic small vessel disease with chronic lacunes. Electronically Signed: By: Monte Fantasia M.D. On: 07/02/2021 19:39   MR Cervical Spine W or Wo Contrast  Result Date: 07/12/2021 CLINICAL DATA:  Metastatic disease evaluation EXAM: MRI CERVICAL SPINE WITHOUT AND WITH CONTRAST TECHNIQUE: Multiplanar and multiecho pulse sequences of the cervical spine, to include the craniocervical junction and cervicothoracic junction, were obtained without and with intravenous contrast. CONTRAST:  70mL GADAVIST GADOBUTROL 1 MMOL/ML IV SOLN COMPARISON:  06/07/2021 FINDINGS: Alignment: Reversal of the normal cervical lordosis. Trace anterolisthesis of C3 on C4. Vertebrae: Redemonstrated marrow replacing lesions in the C6 and T1 vertebral bodies, as well as the C5 and C7 vertebral bodies to a lesser extent, extending to the right pedicles and posterior elements. Again noted are bulky extraosseous soft tissue masses, which extend into the right foramina at C5-C6, C6-C7, C7-T1, and T1-T2. One total, the mass measures up to 5.9 x 3.2 x 3.6 cm (series 20, image 5 and series 16, image 27), previously 5.6 x 2.2 x 3.5 cm. The posterior aspect of the right first and second ribs are also likely involved. No pathologic fracture is seen. Cord: Normal signal and morphology. Extraosseous extension of tumor posteriorly at C6, and on the right at C7 and T1, overall unchanged. No evidence of cord compression. Posterior Fossa, vertebral arteries, paraspinal tissues: Vertebral artery flow voids are maintained, although again noted is encasement of the right vertebral artery at the level of C6, as well as now at C5. Extension of the previously noted soft tissue masses in the right paraspinal soft tissues, as described above. Redemonstrated infarct in the pons. Disc levels:  C2-C3: Disc osteophyte complex. Left greater than right facet arthropathy. No spinal canal stenosis. Moderate to severe left neural foraminal narrowing. C3-C4: Trace anterolisthesis with disc unroofing. Mild left greater than right facet arthropathy. No spinal canal stenosis. No neural foraminal narrowing. C4-C5: Minimal disc bulge. Uncovertebral and facet arthropathy. No spinal canal stenosis or neural foraminal narrowing. C5-C6: Disc height loss with minimal disc bulge. Uncovertebral and facet arthropathy. No spinal canal stenosis. Tumor is again seen in the right neural foramen. No epidural extension of tumor at this level. C6-C7: Disc osteophyte. Facet and uncovertebral hypertrophy. Tumor is again noted in the right neural foramen. No spinal canal stenosis. No epidural extension of tumor. C7-T1: Complete occlusion of the right neural foramen secondary to tumor. There is epidural extension, with mass effect on the thecal sac but no spinal canal stenosis. The left neural foramen is unremarkable. The right T1-T2 neural foramen also appears completely occluded secondary to tumor. IMPRESSION: 1. Redemonstrated marrow replacing lesions in the C6 and T1 vertebral bodies, as well as the C5 and C7 vertebral bodies to a lesser extent, primarily involving the right aspect of the vertebral bodies, pedicles, and posterior elements. This has slightly increased in size compared to the prior exam. Epidural extension of tumor at C7-T1 and T1-T2, with mild mass effect on the thecal sac but no significant canal stenosis or cord signal abnormality. Associated extraosseous soft tissue in the right-sided foramina again noted at C5-C6, C6-C7, C7-T1, and T1-T2. 2. Redemonstrated encasement of the right vertebral artery at the C6 level, now with some encasement at C5. The flow void is maintained. Electronically Signed   By: Merilyn Baba M.D.   On:  07/12/2021 18:32   NM PET Image Initial (PI) Skull Base To Thigh  Result Date:  07/08/2021 CLINICAL DATA:  Initial treatment strategy for lung nodule. EXAM: NUCLEAR MEDICINE PET SKULL BASE TO THIGH TECHNIQUE: 5.6 mCi F-18 FDG was injected intravenously. Full-ring PET imaging was performed from the skull base to thigh after the radiotracer. CT data was obtained and used for attenuation correction and anatomic localization. Fasting blood glucose: 170 mg/dl COMPARISON:  Chest CT 07/02/2021 and CT abdomen/pelvis from 06/09/2021 FINDINGS: Mediastinal blood pool activity: SUV max 3.0 Liver activity: SUV max 3.6 NECK: The left temporal lobe lesion is faintly seen on the top most CT image (image 1, series 4) but not well appreciated on the PET images. Likewise the calvarial lesions are not included. The destructive spinal lesions are discussed under the skeletal section below. Incidental CT findings: Right mastoid effusion. CHEST: The confluent triangular left upper lobe mass measures 3.9 by 3.2 cm on image 36 series 8 with maximum SUV 8.2, compatible with malignancy. AP window lymph node 0.8 cm in short axis on image 57 series 4, maximum SUV 3.3. Scattered sessile and exophytic cutaneous nodules are identified along the neck, chest, and abdomen, a representative exophytic presternal lesion measuring 1.3 by 1.1 cm has a maximum SUV of 2.7. These are probably neurofibromas. Incidental CT findings: Biapical pleuroparenchymal scarring. Mild paraseptal emphysema. Mild scarring in the left lower lobe. The subtle clustered nodularity in the right upper lobe is roughly stable and not appreciably hypermetabolic. ABDOMEN/PELVIS: 2.4 by 2.8 cm left adrenal mass on image 100 series 4, maximum SUV 3.9. No fatty elements in this mass, precontrast density is 27 Hounsfield units. This is likely a metastatic lesion although has substantially less metabolic activity than the primary tumor. Scattered accentuated segmental metabolic activity in bowel, likely physiologic. Incidental CT findings: Potential wall thickening  in the nondistended cecum, ascending colon, transverse colon, and proximal descending colon-colitis not excluded. Scattered colonic diverticula. Aortoiliac atherosclerotic vascular disease. Diverticulum of the proximal transverse duodenum. SKELETON: Hypermetabolic destructive mass of the right C6 vertebral body as depicted on recent examinations, with involvement of the right posterior elements, maximum SUV 6.4, compatible with metastatic lesion. Expansile destructive lesion of the right T1 vertebral body with extension into the right neural foramen at C7-T1, involvement of right C7 and T1 posterior elements, and involvement of the right T1 transverse process with some posterior erosion of the right first rib, maximum SUV 12.7, compatible with metastatic lesion. No other hypermetabolic osseous metastatic lesions are identified. Incidental CT findings: none IMPRESSION: 1. Hypermetabolic left upper lobe mass with maximum SUV 8.2, with associated destructive hypermetabolic metastatic lesions involving the right C6, C7, and T1 vertebra as depicted on recent examinations. The left adrenal mass is only mildly hypermetabolic with maximum SUV of 3.9 but still remains suspicious for a metastatic lesion. 2. The calvarial and intracranial lesions are not well depicted on today's PET-CT. 3. Potential wall thickening in the nondistended cecum, ascending colon, transverse colon, and descending colon-colitis is not excluded. 4. No substantial accentuated metabolic activity in the subtle clustered nodularity in the right upper lobe which is probably inflammatory but merit surveillance. 5. 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 6. Other imaging findings of potential clinical significance: Right mastoid effusion. Mild paraseptal emphysema. Aortic Atherosclerosis (ICD10-I70.0) and Emphysema (ICD10-J43.9). Electronically Signed   By:  Van Clines M.D.   On: 07/08/2021 10:58   DG CHEST PORT 1 VIEW  Result  Date: 07/06/2021 CLINICAL DATA:  Post bronchoscopy. EXAM: PORTABLE CHEST 1 VIEW COMPARISON:  CT chest dated July 02, 2021. FINDINGS: The heart size and mediastinal contours are within normal limits. Grossly unchanged left upper lobe mass. Mild right basilar atelectasis. No pleural effusion or pneumothorax. No acute osseous abnormality. IMPRESSION: 1. No pneumothorax status post bronchoscopy. 2. Grossly unchanged left upper lobe mass. Electronically Signed   By: Titus Dubin M.D.   On: 07/06/2021 09:18   CT Super D Chest Wo Contrast  Result Date: 07/05/2021 CLINICAL DATA:  Metastatic lung cancer EXAM: CT CHEST WITHOUT CONTRAST TECHNIQUE: Multidetector CT imaging of the chest was performed using thin slice collimation for electromagnetic bronchoscopy planning purposes, without intravenous contrast. COMPARISON:  CT chest dated 06/09/2021 FINDINGS: Cardiovascular: The heart is top-normal in size. No pericardial effusion. No evidence of thoracic aortic aneurysm. Atherosclerotic calcifications of the aortic arch. Mild coronary atherosclerosis of the LAD and left circumflex. Mediastinum/Nodes: 8 mm short axis subcarinal node, within normal limits. Visualized thyroid is unremarkable. Lungs/Pleura: 4.0 x 3.2 cm aggregate lesion in the left upper lobe (series 3/images 63 and 65), previously 3.5 x 3.0 cm when measured in a similar fashion. Adjacent 13 mm central left upper lobe nodule (series 3/image 62), previously 12 mm. This corresponds to the patient's known primary bronchogenic neoplasm. Biapical pleural-parenchymal scarring. Faint peribronchovascular ground-glass and solid nodules in the right upper lobe (series 3/images 49 and 58), favoring infection/inflammation over metastatic disease. Mild subpleural scarring with volume loss in the left lower lobe. No focal consolidation. Mild paraseptal emphysematous changes, upper lung  predominant. No pleural effusion or pneumothorax. Upper Abdomen: 2.5 x 2.5 cm left adrenal metastasis, incompletely visualized, previously 2.0 x 2.0 cm. Musculoskeletal: Destructive lytic metastasis involving the right T1 vertebral body and transverse process (series 2/image 1), incompletely visualized, similar. IMPRESSION: 4.0 cm aggregate left upper lobe mass with adjacent 13 mm central left upper lobe nodule, corresponding to the patient's known primary bronchogenic neoplasm, mildly progressive. Faint peribronchovascular ground-glass and solid nodules in the right upper lobe, favoring infection/inflammation over metastatic disease. 2.5 cm left adrenal metastasis, incompletely visualized, progressive. Destructive lytic metastasis involving the right T1 vertebral body and transverse process, incompletely visualized, similar. Aortic Atherosclerosis (ICD10-I70.0) and Emphysema (ICD10-J43.9). Electronically Signed   By: Julian Hy M.D.   On: 07/05/2021 03:13   DG C-ARM BRONCHOSCOPY  Result Date: 07/06/2021 C-ARM BRONCHOSCOPY: Fluoroscopy was utilized by the requesting physician.  No radiographic interpretation.    ASSESSMENT AND PLAN: This is a pleasant 65 year old African-American female with extensive stage small cell lung cancer.  She presented with a left upper lobe lung mass in addition to small pulmonary nodules as well as a left adrenal gland lesion and T1 lytic lesion.  Further work-up demonstrated brain metastases and multiple hypermetabolic bone lesions including C6, C7, and T1.  The diagnosis, staging, and prognosis were discussed with the patient and her daughter today.  We discussed that her disease is not curable and that treatment is focused on improving her symptoms including her right-sided weakness and extending her life. We recommended evaluation and treatment of her brain and bone lesions with radiation.  I have reached out to radiation oncology to make sure they are aware of the  patient's admission that they can expedite treatment.  Once radiation is completed, she will receive systemic chemotherapy by Dr. Julien Nordmann.  He will outline a detailed regimen with the patient and her daughter at her next visit.  The patient was scheduled to see him in  our office tomorrow.  I have canceled this appointment and asked for a new appointment to be rescheduled in approximately 2 weeks.  Medical oncology will follow peripherally while she is in the hospital.  Please call if any additional questions arise during her hospitalization.  The total time spent in the visit was 30 minutes. Greater than 50%  of this time was spent counseling and coordinating care related to the above assessment and plan.    LOS: 1 day   Mikey Bussing, DNP, AGPCNP-BC, AOCNP 07/13/21

## 2021-07-14 ENCOUNTER — Telehealth: Payer: Self-pay | Admitting: Internal Medicine

## 2021-07-14 ENCOUNTER — Other Ambulatory Visit: Payer: Medicare Other

## 2021-07-14 ENCOUNTER — Ambulatory Visit
Admit: 2021-07-14 | Discharge: 2021-07-14 | Disposition: A | Discharge status: Home / Self Care | Payer: Medicare Other | Attending: Radiation Oncology | Admitting: Radiation Oncology

## 2021-07-14 ENCOUNTER — Ambulatory Visit: Payer: Medicare Other | Admitting: Internal Medicine

## 2021-07-14 DIAGNOSIS — C7951 Secondary malignant neoplasm of bone: Secondary | ICD-10-CM

## 2021-07-14 DIAGNOSIS — C7931 Secondary malignant neoplasm of brain: Secondary | ICD-10-CM

## 2021-07-14 LAB — GLUCOSE, CAPILLARY
Glucose-Capillary: 197 mg/dL — ABNORMAL HIGH (ref 70–99)
Glucose-Capillary: 343 mg/dL — ABNORMAL HIGH (ref 70–99)
Glucose-Capillary: 206 mg/dL — ABNORMAL HIGH (ref 70–99)
Glucose-Capillary: 292 mg/dL — ABNORMAL HIGH (ref 70–99)

## 2021-07-14 LAB — T4, FREE: Free T4: 0.68 ng/dL (ref 0.61–1.12)

## 2021-07-14 NOTE — Progress Notes (Signed)
Ms. Rambeau was brought into clinic from in-patient to discuss additional radiation treatment options for brain lesions found on most recent scans  Dose of Decadron, if applicable: receiving 4mg  IV every 8 hours while in-patient  Recent neurologic symptoms, if any:  Seizures: Patient denies Headaches: Reports occasional/mild occurrences. 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 Continues to have bilateral weakness to her lower extremity and diminshed use of upper extremity Visual deficits/changes: Patient denies Confusion/Memory deficits: Patient denies  Ambulatory status? Walker? Wheelchair?:  From PT/OT evaluations:  --PT: Min assist for bed mobility. Min assist of 2 for stand pivot transfer. Pt fatigues quickly with minimal activity. I instructed pt/daughter in BLE strengthening exercises to be done independently to minimize deconditioning during hospitalization. Daughter present and stated she feels she can manage her mom's care at home with a WC. She plans to have a ramp built. If ramp isn't in place at time of DC, pt would likely need PTAR assist for entering home. Pt had 2 falls in the week prior to admission so is a high fall risk.  Pt currently with functional limitations due to the deficits --OT: PTA pt needed min A with bathing, dressing, functional transfers and short distance mobility. setup with meals. Pt currently needs up to mod A with LB ADLs and stand-pivot transfers;  SAFETY ISSUES: Prior radiation? No Pacemaker/ICD? No Possible current pregnancy? No--postmenopausal Is the patient on methotrexate?  No

## 2021-07-14 NOTE — Telephone Encounter (Signed)
R/s appts per 8/23 sch msg. Pt's daughter is aware.

## 2021-07-14 NOTE — Progress Notes (Signed)
Inpatient Diabetes Program Recommendations  AACE/ADA: New Consensus Statement on Inpatient Glycemic Control (2015)  Target Ranges:  Prepandial:   less than 140 mg/dL      Peak postprandial:   less than 180 mg/dL (1-2 hours)      Critically ill patients:  140 - 180 mg/dL   Lab Results  Component Value Date   GLUCAP 292 (H) 07/14/2021   HGBA1C 8.0 (H) 07/13/2021    Review of Glycemic Control Results for Lynn Wiley, Lynn Wiley (MRN [id number]) as of 07/14/2021 13:35  Ref. Range 07/13/2021 07:43 07/13/2021 12:01 07/13/2021 17:20 07/13/2021 19:48 07/14/2021 07:34 07/14/2021 11:27  Glucose-Capillary Latest Ref Range: 70 - 99 mg/dL 158 (H) 236 (H) 197 (H) 343 (H) 206 (H) 292 (H)   Diabetes history: None drug induced hyperglycemia  Current orders for Inpatient glycemic control:  Novolog 0-9 units tid + hs  Decadron 4 mg Q8 hours A1c 8% on 8/23  Inpatient Diabetes Program Recommendations:    Note insulin not being given within 1 hour of glucose checks  -  Add Novolog 3 units tid meal coverage if eating >50% of meals  Will see pt on 8/25 for teaching  Thanks,  Tama Headings RN, MSN, BC-ADM Inpatient Diabetes Coordinator Team Pager [phone] (8a-5p)

## 2021-07-14 NOTE — Progress Notes (Signed)
Progress Note    Lynn Wiley  MEQ:[id number] DOB: Nov 16, 1956  DOA: 07/12/2021 PCP: Marzetta Board, NP    Brief Narrative:     Medical records reviewed and are as summarized below:  Lynn Wiley is an 65 y.o. 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  Secondary to cervical metastasis.  Radiation oncologist recommended patient proceed to the emergency department where she was admitted for radiation oncology evaluation and treatment.  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  Secondary to metastatic lung cancer. --Radiation oncology and oncology consultations: Once radiation is completed, she will receive systemic chemotherapy by Dr. Julien Nordmann.  He will outline a detailed regimen with the patient and her daughter at her next visit.  The patient was scheduled to see him in our office tomorrow.  I have canceled this appointment and asked for a new appointment to be rescheduled in approximately 2 weeks. -simulation planned for Friday   Essential hypertension -- Stable   Hyperglycemia, hemoglobin A1c 8, may be drug-induced secondary to prolonged prednisone.   -- Sliding scale insulin   Metastatic lung cancer -- As per pulmonology/oncology    TSH low --asymptomatic -T4 normal   Neurofibromatosis  --stable   Skin Assessment: I agree with the wound assessment as performed by the wound care RN as outlined below: Pressure Injury 07/13/21 Buttocks Medial Stage 2 -  Partial thickness loss of dermis presenting as a shallow open injury  with a red, pink wound bed without slough. pink read (Active)  07/13/21 0030  Location: Buttocks  Location Orientation: Medial  Staging: Stage 2 -  Partial thickness loss of dermis presenting as a shallow open injury with a red, pink wound bed without slough.  Wound Description (Comments): pink read  Present on Admission: Yes     Pressure Injury 07/13/21 Buttocks (Active)  07/13/21 0050  Location: Buttocks  Location Orientation:   Staging:   Wound Description (Comments):   Present on Admission:      Family Communication/Anticipated D/C date and plan/Code Status   DVT prophylaxis: Lovenox ordered. Code Status: Full Code.  Disposition Plan: Status is: Inpatient  Remains inpatient appropriate because:Inpatient level of care appropriate due to severity of illness  Dispo: The patient is from: Home              Anticipated d/c is to: Home              Patient currently is not medically stable to d/c.   Difficult to place patient No         Medical Consultants:   Oncology Radiation oncology     Subjective:   Feels well- ate breakfast  Objective:    Vitals:   07/13/21 0724 07/13/21 1207 07/13/21 1950 07/14/21 1358  BP: (!) 171/79 (!) 150/81 (!) 167/83 (!) 164/97  Pulse: 64 65 74 68  Resp: 14 16 17 19   Temp: 98.6 F (37 C) 98.5 F (36.9 C) 98.5 F (36.9 C) 99 F (37.2 C)  TempSrc: Oral Oral Oral Oral  SpO2: 97% 98% 100% 97%  Intake/Output Summary (Last 24 hours) at 07/14/2021 1428 Last data filed at 07/14/2021 0912 Gross per 24 hour  Intake 355 ml  Output --  Net 355 ml   There were no vitals filed for this visit.  Exam:  General: Appearance:    Thin female in no acute distress     Lungs:     respirations unlabored  Heart:    Normal heart rate. Normal rhythm. No murmurs, rubs, or gallops.    MS:   weakness   Neurologic:   Awake, alert, oriented x 3. No apparent focal neurological           defect.      Data Reviewed:   I have personally  reviewed following labs and imaging studies:  Labs: Labs show the following:   Basic Metabolic Panel: Recent Labs  Lab 07/12/21 1420 07/13/21 0456  NA 142 140  K 4.2 3.9  CL 109 108  CO2 25 23  GLUCOSE 282* 223*  BUN 33* 32*  CREATININE 0.61 0.46  CALCIUM 8.2* 8.4*  MG  --  1.9  PHOS  --  3.6   GFR Estimated Creatinine Clearance: 56 mL/min (by C-G formula based on SCr of 0.46 mg/dL). Liver Function Tests: Recent Labs  Lab 07/13/21 0456  AST 12*  ALT 18  ALKPHOS 49  BILITOT 0.5  PROT 5.0*  ALBUMIN 2.6*   No results for input(s): LIPASE, AMYLASE in the last 168 hours. No results for input(s): AMMONIA in the last 168 hours. Coagulation profile No results for input(s): INR, PROTIME in the last 168 hours.  CBC: Recent Labs  Lab 07/12/21 1420 07/13/21 0456  WBC 12.6* 11.1*  NEUTROABS  --  10.3*  HGB 12.6 12.0  HCT 38.6 38.8  MCV 92.8 96.5  PLT 127* 118*   Cardiac Enzymes: No results for input(s): CKTOTAL, CKMB, CKMBINDEX, TROPONINI in the last 168 hours. BNP (last 3 results) No results for input(s): PROBNP in the last 8760 hours. CBG: Recent Labs  Lab 07/13/21 1201 07/13/21 1720 07/13/21 1948 07/14/21 0734 07/14/21 1127  GLUCAP 236* 197* 343* 206* 292*   D-Dimer: No results for input(s): DDIMER in the last 72 hours. Hgb A1c: Recent Labs    07/13/21 0456  HGBA1C 8.0*   Lipid Profile: No results for input(s): CHOL, HDL, LDLCALC, TRIG, CHOLHDL, LDLDIRECT in the last 72 hours. Thyroid function studies: Recent Labs    07/13/21 0456  TSH 0.168*   Anemia work up: No results for input(s): VITAMINB12, FOLATE, FERRITIN, TIBC, IRON, RETICCTPCT in the last 72 hours. Sepsis Labs: Recent Labs  Lab 07/12/21 1420 07/13/21 0456  WBC 12.6* 11.1*    Microbiology Recent Results (from the past 240 hour(s))  SARS CORONAVIRUS 2 (TAT 6-24 HRS) Nasopharyngeal Nasopharyngeal Swab     Status: None   Collection Time: 07/12/21  9:11 PM   Specimen:  Nasopharyngeal Swab  Result Value Ref Range Status   SARS Coronavirus 2 NEGATIVE NEGATIVE Final    Comment: (NOTE) SARS-CoV-2 target nucleic acids are NOT DETECTED.  The SARS-CoV-2 RNA is generally detectable in upper and lower respiratory specimens during the acute phase of infection. Negative results do not preclude SARS-CoV-2 infection, do not rule out co-infections with other pathogens, and should not be used as the sole basis for treatment or other patient management decisions. Negative results must be combined with clinical observations, patient history, and epidemiological information. The expected result is Negative.  Fact Sheet for Patients: SugarRoll.be  Fact Sheet for Healthcare Providers:  https://www.woods-mathews.com/  This test is not yet approved or cleared by the [place] and  has been authorized for detection and/or diagnosis of SARS-CoV-2 by FDA under an Emergency Use Authorization (EUA). This EUA will remain  in effect (meaning this test can be used) for the duration of the COVID-19 declaration under Se ction 564(b)(1) of the Act, 21 U.S.C. section 360bbb-3(b)(1), unless the authorization is terminated or revoked sooner.  Performed at [facility], [place] [address] [place], [place] [address]     Procedures and diagnostic studies:  CT HEAD WO CONTRAST (5MM)  Result Date: 07/12/2021 CLINICAL DATA:  Fall, rule out hemorrhage EXAM: CT HEAD WITHOUT CONTRAST TECHNIQUE: Contiguous axial images were obtained from the base of the skull through the vertex without intravenous contrast. COMPARISON:  CT brain, 06/19/2021 MR brain, 07/02/2021 FINDINGS: Brain: No evidence of acute infarction, hemorrhage, hydrocephalus, extra-axial collection or mass lesion/mass effect. Extensive periventricular and deep white matter hypodensity with chronic lacunar infarctions of the bilateral corona radiata (series 2, image 19). Known  left temporal metastases are not well appreciated by noncontrast CT. Vascular: No hyperdense vessel or unexpected calcification. Skull: Redemonstrated large partially calcified lytic lesion of the right parietal vertex (series 3, image 26) as well as a smaller lytic lesion involving the inner table of the more anterior right parietal lobe (series 3, image 22). Negative for fracture or focal lesion. Sinuses/Orbits: No acute finding. Other: None. IMPRESSION: 1. No acute intracranial pathology. 2. Advanced small-vessel white matter disease with chronic lacunar infarctions of the bilateral corona radiata. 3. Known left temporal metastases seen on prior contrast enhanced MR are not well appreciated by noncontrast CT. 4. Redemonstrated large partially calcified lytic lesion of the right parietal vertex as well as a smaller lytic lesion involving the inner table of the more anterior right parietal lobe. Electronically Signed   By: Eddie Candle M.D.   On: 07/12/2021 15:22   MR BRAIN WO CONTRAST  Result Date: 07/12/2021 CLINICAL DATA:  Lung cancer. Metastatic disease. New onset right-sided weakness. EXAM: MRI HEAD WITHOUT CONTRAST TECHNIQUE: Multiplanar, multiecho pulse sequences of the brain and surrounding structures were obtained without intravenous contrast. COMPARISON:  Eighty crest pattern radiology area on malignant this FINDINGS: Brain: T2 and FLAIR signal changes are again noted in the previously identified metastatic brain lesions. The largest is in the inferior left temporal lobe measuring 17 mm. Additional lesions involving the left thalamus, posterior left temporal white matter in left frontal lobe are again noted. T2 signal changes associated with a lesion in the peripheral right cerebellum noted. Multiple remote lacunar infarcts are present in the periventricular white matter, right lentiform nucleus, right pons, and bilateral cerebellum without significant interval change. No acute infarct is present.  No  acute hemorrhage is present. The internal auditory canals are within normal limits. Vascular: Flow is present in the major intracranial arteries. Skull and upper cervical spine: Metastatic lesions to the skull are stable. The largest lesion is in the right occipital skull, measuring over 6.5 cm in maximal dimension. Smaller lesion just anterior in the right parietal skull measures up to 17 mm. Multiple other smaller lesions are again seen. See MRI of the cervical spine same day for description of metastatic disease in the cervical spine. Craniocervical junction is within normal limits. Sinuses/Orbits: Right mastoid effusion is again noted. The paranasal sinuses and mastoid air cells are otherwise clear. The globes and orbits are within normal limits. IMPRESSION: 1. Stable appearance of metastatic brain lesions. 2.  Metastatic lesions to the skull are stable. 3. No acute intracranial abnormality to explain the new weakness. 4. Multiple remote lacunar infarcts involving the periventricular white matter, right lentiform nucleus, right pons, and bilateral cerebellum are stable. 5. Right mastoid effusion. Electronically Signed   By: San Morelle M.D.   On: 07/12/2021 18:08   MR Cervical Spine W or Wo Contrast  Result Date: 07/12/2021 CLINICAL DATA:  Metastatic disease evaluation EXAM: MRI CERVICAL SPINE WITHOUT AND WITH CONTRAST TECHNIQUE: Multiplanar and multiecho pulse sequences of the cervical spine, to include the craniocervical junction and cervicothoracic junction, were obtained without and with intravenous contrast. CONTRAST:  42mL GADAVIST GADOBUTROL 1 MMOL/ML IV SOLN COMPARISON:  06/07/2021 FINDINGS: Alignment: Reversal of the normal cervical lordosis. Trace anterolisthesis of C3 on C4. Vertebrae: Redemonstrated marrow replacing lesions in the C6 and T1 vertebral bodies, as well as the C5 and C7 vertebral bodies to a lesser extent, extending to the right pedicles and posterior elements. Again noted are  bulky extraosseous soft tissue masses, which extend into the right foramina at C5-C6, C6-C7, C7-T1, and T1-T2. One total, the mass measures up to 5.9 x 3.2 x 3.6 cm (series 20, image 5 and series 16, image 27), previously 5.6 x 2.2 x 3.5 cm. The posterior aspect of the right first and second ribs are also likely involved. No pathologic fracture is seen. Cord: Normal signal and morphology. Extraosseous extension of tumor posteriorly at C6, and on the right at C7 and T1, overall unchanged. No evidence of cord compression. Posterior Fossa, vertebral arteries, paraspinal tissues: Vertebral artery flow voids are maintained, although again noted is encasement of the right vertebral artery at the level of C6, as well as now at C5. Extension of the previously noted soft tissue masses in the right paraspinal soft tissues, as described above. Redemonstrated infarct in the pons. Disc levels: C2-C3: Disc osteophyte complex. Left greater than right facet arthropathy. No spinal canal stenosis. Moderate to severe left neural foraminal narrowing. C3-C4: Trace anterolisthesis with disc unroofing. Mild left greater than right facet arthropathy. No spinal canal stenosis. No neural foraminal narrowing. C4-C5: Minimal disc bulge. Uncovertebral and facet arthropathy. No spinal canal stenosis or neural foraminal narrowing. C5-C6: Disc height loss with minimal disc bulge. Uncovertebral and facet arthropathy. No spinal canal stenosis. Tumor is again seen in the right neural foramen. No epidural extension of tumor at this level. C6-C7: Disc osteophyte. Facet and uncovertebral hypertrophy. Tumor is again noted in the right neural foramen. No spinal canal stenosis. No epidural extension of tumor. C7-T1: Complete occlusion of the right neural foramen secondary to tumor. There is epidural extension, with mass effect on the thecal sac but no spinal canal stenosis. The left neural foramen is unremarkable. The right T1-T2 neural foramen also  appears completely occluded secondary to tumor. IMPRESSION: 1. Redemonstrated marrow replacing lesions in the C6 and T1 vertebral bodies, as well as the C5 and C7 vertebral bodies to a lesser extent, primarily involving the right aspect of the vertebral bodies, pedicles, and posterior elements. This has slightly increased in size compared to the prior exam. Epidural extension of tumor at C7-T1 and T1-T2, with mild mass effect on the thecal sac but no significant canal stenosis or cord signal abnormality. Associated extraosseous soft tissue in the right-sided foramina again noted at C5-C6, C6-C7, C7-T1, and T1-T2. 2. Redemonstrated encasement of the right vertebral artery at the C6 level, now with some encasement at C5. The flow void is maintained. Electronically Signed   By:  Merilyn Baba M.D.   On: 07/12/2021 18:32    Medications:    amLODipine  10 mg Oral BID   aspirin EC  81 mg Oral Daily   carvedilol  12.5 mg Oral BID   dexamethasone (DECADRON) injection  4 mg Intravenous Q8H   insulin aspart  0-5 Units Subcutaneous QHS   insulin aspart  0-9 Units Subcutaneous TID WC   pantoprazole  40 mg Oral Daily   pravastatin  40 mg Oral QHS   senna-docusate  2 tablet Oral Daily   sodium chloride flush  3 mL Intravenous Q12H   Continuous Infusions:  sodium chloride       LOS: 2 days   Geradine Girt  [facility]   How to contact the [facility] Attending or Consulting provider Minor or covering provider during after hours [place], for this patient?  Check the care team in [facility] and look for a) attending/consulting [facility] provider listed and b) the [facility] team listed Log into [email or web address] and use [place]'s universal password to access. If you do not have the password, please contact the hospital operator. Locate the [facility] provider you are looking for under [facility] and page to a number that you can be directly reached. If you still have difficulty reaching the provider, please page the [facility]  (Director on Call) for the Hospitalists listed on amion for assistance.  07/14/2021, 2:28 PM

## 2021-07-15 LAB — GLUCOSE, CAPILLARY
Glucose-Capillary: 298 mg/dL — ABNORMAL HIGH (ref 70–99)
Glucose-Capillary: 318 mg/dL — ABNORMAL HIGH (ref 70–99)
Glucose-Capillary: 186 mg/dL — ABNORMAL HIGH (ref 70–99)
Glucose-Capillary: 245 mg/dL — ABNORMAL HIGH (ref 70–99)
Glucose-Capillary: 216 mg/dL — ABNORMAL HIGH (ref 70–99)

## 2021-07-15 MED ORDER — HYDRALAZINE HCL 10 MG PO TABS
10.0000 mg | ORAL_TABLET | Freq: Four times a day (QID) | ORAL | Status: DC
  Administered 2021-07-15 – 2021-07-16 (×5): 10 mg via ORAL
  Filled 2021-07-15 (×6): qty 1

## 2021-07-15 MED ORDER — INSULIN ASPART 100 UNIT/ML IJ SOLN
3.0000 [IU] | Freq: Three times a day (TID) | INTRAMUSCULAR | Status: DC
  Administered 2021-07-15 – 2021-07-16 (×4): 3 [IU] via SUBCUTANEOUS

## 2021-07-15 MED ORDER — DEXAMETHASONE 4 MG PO TABS
4.0000 mg | ORAL_TABLET | Freq: Three times a day (TID) | ORAL | Status: DC
  Administered 2021-07-15 – 2021-07-16 (×3): 4 mg via ORAL
  Filled 2021-07-15 (×3): qty 1

## 2021-07-15 MED ORDER — INSULIN GLARGINE-YFGN 100 UNIT/ML ~~LOC~~ SOLN
10.0000 [IU] | Freq: Every day | SUBCUTANEOUS | Status: DC
  Administered 2021-07-15 – 2021-07-16 (×2): 10 [IU] via SUBCUTANEOUS
  Filled 2021-07-15 (×2): qty 0.1

## 2021-07-15 NOTE — Progress Notes (Addendum)
Inpatient Diabetes Program Recommendations  AACE/ADA: New Consensus Statement on Inpatient Glycemic Control (2015)  Target Ranges:  Prepandial:   less than 140 mg/dL      Peak postprandial:   less than 180 mg/dL (1-2 hours)      Critically ill patients:  140 - 180 mg/dL   Lab Results  Component Value Date   GLUCAP 245 (H) 07/15/2021   HGBA1C 8.0 (H) 07/13/2021    Review of Glycemic Control Results for Lynn Wiley, COIN (MRN [id number]) as of 07/15/2021 10:45  Ref. Range 07/14/2021 07:34 07/14/2021 11:27 07/14/2021 16:28 07/14/2021 20:53 07/15/2021 07:38 07/15/2021 08:56  Glucose-Capillary Latest Ref Range: 70 - 99 mg/dL 206 (H) 292 (H) 298 (H) 318 (H) 186 (H) 245 (H)    Diabetes history: None drug induced hyperglycemia?  Current orders for Inpatient glycemic control:  Novolog 0-9 units tid + hs Novolog 3 units tid meal coverage  Decadron 4 mg Q8 hours A1c 8% on 8/23  Inpatient Diabetes Program Recommendations:    -  Add Semglee 12 units  Will see pt today for teaching  Addendum 3244: Spoke with pt and family member at bedside. Family member has experience in the medical field and is a med tech and will be giving pt her insulin injections at home. Reviewed how to use insulin pen and when to check glucose. Reviewed hypoglycemia and s/s and treatment. Family member requests freestyle libre at time of d/c. Pt may need pre-authorization from PCP but I can give her 2 sensors to give time for pre authorization.  At time of d/c: [facility] 2 Reader order # [id number] [facility] 2 Sensor order # [id number] Insulin pen needles order #[id number]  Thanks,  Tama Headings RN, MSN, BC-ADM Inpatient Diabetes Coordinator Team Pager [phone] (8a-5p)

## 2021-07-15 NOTE — Clinical Social Work Note (Signed)
    Durable Medical Equipment  (From admission, onward)           Start     Ordered   07/14/21 0858  For home use only DME lightweight manual wheelchair with seat cushion  Once       Comments: Patient suffers from weakness which impairs their ability to perform daily activities like dressing in the home.  A walker will not resolve  issue with performing activities of daily living. A wheelchair will allow patient to safely perform daily activities. Patient is not able to propel themselves in the home using a standard weight wheelchair due to arm weakness. Patient can self propel in the lightweight wheelchair. Length of need Lifetime. Accessories: elevating leg rests (ELRs), wheel locks, extensions and anti-tippers.  transport chair- has caregiver for propulsion   07/14/21 [phone]

## 2021-07-15 NOTE — Progress Notes (Signed)
Progress Note    Lynn Wiley  ZOX:[id number] DOB: 13-Feb-1956  DOA: 07/12/2021 PCP: Marzetta Board, NP    Brief Narrative:     Medical records reviewed and are as summarized below:  Lynn Wiley is an 65 y.o. 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  Secondary to cervical metastasis.  Radiation oncologist recommended patient proceed to the emergency department where she was admitted for radiation oncology evaluation and treatment.  Assessment/Plan:   Active Problems:   Neurofibromatosis ([place])   Bone metastases (HCC)   HTN (hypertension)   Hyperglycemia, drug-induced   Lung cancer (HCC)   Pressure injury of skin   Small cell lung cancer (HCC)   Low serum thyroid stimulating hormone (TSH)   Cervical spine metastatic disease with associated right hand weakness and numbness.  Secondary to metastatic lung cancer. --Radiation oncology and oncology consultations: Once radiation is completed, she will receive systemic chemotherapy by Dr. Julien Nordmann.  He will outline a detailed regimen with the patient and her daughter at her next visit.  The patient was scheduled to see him in our office tomorrow.  I have canceled this appointment and asked for a new appointment to be rescheduled in approximately 2 weeks. -simulation planned for Friday- home after? -will need 14 treatments of radiation   Essential hypertension -- Stable   Hyperglycemia, hemoglobin A1c 8, may be drug-induced secondary to prolonged prednisone.   -- Sliding scale insulin   Metastatic lung cancer -- As per pulmonology/oncology    TSH low --asymptomatic -T4 normal   Neurofibromatosis  --stable   Skin Assessment: I agree with the wound assessment as performed by the wound care RN as outlined below: Pressure Injury 07/13/21 Buttocks Medial Stage 2 -  Partial thickness  loss of dermis presenting as a shallow open injury with a red, pink wound bed without slough. pink read (Active)  07/13/21 0030  Location: Buttocks  Location Orientation: Medial  Staging: Stage 2 -  Partial thickness loss of dermis presenting as a shallow open injury with a red, pink wound bed without slough.  Wound Description (Comments): pink read  Present on Admission: Yes     Pressure Injury 07/13/21 Buttocks (Active)  07/13/21 0050  Location: Buttocks  Location Orientation:   Staging:   Wound Description (Comments):   Present on Admission:      Family Communication/Anticipated D/C date and plan/Code Status   DVT prophylaxis: Lovenox ordered. Code Status: Full Code.  Disposition Plan: Status is: Inpatient Spoke with daughter on phone Remains inpatient appropriate because:Inpatient level of care appropriate due to severity of illness  Dispo: The patient is from: Home              Anticipated d/c is to: Home              Patient currently is not medically stable to d/c. Home after simulation?   Difficult to place patient No         Medical Consultants:   Oncology Radiation oncology     Subjective:   No complaints   Objective:    Vitals:   07/14/21 1358 07/14/21 2051 07/15/21 0514 07/15/21 1150  BP: (!) 164/97 139/87 (!) 172/91 (!) 155/91  Pulse: 68 95 70 64  Resp: 19 20 18    Temp: 99 F (37.2 C) 98.8 F (37.1 C) 97.8 F (36.6 C)   TempSrc:  Oral Oral Oral   SpO2: 97% 98% 99% 97%    Intake/Output Summary (Last 24 hours) at 07/15/2021 1243 Last data filed at 07/15/2021 0859 Gross per 24 hour  Intake 361 ml  Output 350 ml  Net 11 ml   There were no vitals filed for this visit.  Exam:   General: Appearance:    Thin female in no acute distress     Lungs:     respirations unlabored  Heart:    Normal heart rate. Normal rhythm. No murmurs, rubs, or gallops.    MS:   All extremities are intact.    Neurologic:   Awake, alert, oriented x 3.         Data Reviewed:   I have personally reviewed following labs and imaging studies:  Labs: Labs show the following:   Basic Metabolic Panel: Recent Labs  Lab 07/12/21 1420 07/13/21 0456  NA 142 140  K 4.2 3.9  CL 109 108  CO2 25 23  GLUCOSE 282* 223*  BUN 33* 32*  CREATININE 0.61 0.46  CALCIUM 8.2* 8.4*  MG  --  1.9  PHOS  --  3.6   GFR Estimated Creatinine Clearance: 56 mL/min (by C-G formula based on SCr of 0.46 mg/dL). Liver Function Tests: Recent Labs  Lab 07/13/21 0456  AST 12*  ALT 18  ALKPHOS 49  BILITOT 0.5  PROT 5.0*  ALBUMIN 2.6*   No results for input(s): LIPASE, AMYLASE in the last 168 hours. No results for input(s): AMMONIA in the last 168 hours. Coagulation profile No results for input(s): INR, PROTIME in the last 168 hours.  CBC: Recent Labs  Lab 07/12/21 1420 07/13/21 0456  WBC 12.6* 11.1*  NEUTROABS  --  10.3*  HGB 12.6 12.0  HCT 38.6 38.8  MCV 92.8 96.5  PLT 127* 118*   Cardiac Enzymes: No results for input(s): CKTOTAL, CKMB, CKMBINDEX, TROPONINI in the last 168 hours. BNP (last 3 results) No results for input(s): PROBNP in the last 8760 hours. CBG: Recent Labs  Lab 07/14/21 1127 07/14/21 1628 07/14/21 2053 07/15/21 0738 07/15/21 0856  GLUCAP 292* 298* 318* 186* 245*   D-Dimer: No results for input(s): DDIMER in the last 72 hours. Hgb A1c: Recent Labs    07/13/21 0456  HGBA1C 8.0*   Lipid Profile: No results for input(s): CHOL, HDL, LDLCALC, TRIG, CHOLHDL, LDLDIRECT in the last 72 hours. Thyroid function studies: Recent Labs    07/13/21 0456  TSH 0.168*   Anemia work up: No results for input(s): VITAMINB12, FOLATE, FERRITIN, TIBC, IRON, RETICCTPCT in the last 72 hours. Sepsis Labs: Recent Labs  Lab 07/12/21 1420 07/13/21 0456  WBC 12.6* 11.1*    Microbiology Recent Results (from the past 240 hour(s))  SARS CORONAVIRUS 2 (TAT 6-24 HRS) Nasopharyngeal Nasopharyngeal Swab     Status: None   Collection  Time: 07/12/21  9:11 PM   Specimen: Nasopharyngeal Swab  Result Value Ref Range Status   SARS Coronavirus 2 NEGATIVE NEGATIVE Final    Comment: (NOTE) SARS-CoV-2 target nucleic acids are NOT DETECTED.  The SARS-CoV-2 RNA is generally detectable in upper and lower respiratory specimens during the acute phase of infection. Negative results do not preclude SARS-CoV-2 infection, do not rule out co-infections with other pathogens, and should not be used as the sole basis for treatment or other patient management decisions. Negative results must be combined with clinical observations, patient history, and epidemiological information. The expected result is Negative.  Fact Sheet for Patients: SugarRoll.be  Fact Sheet for Healthcare Providers: https://www.woods-mathews.com/  This test is not yet approved or cleared by the [place] FDA and  has been authorized for detection and/or diagnosis of SARS-CoV-2 by FDA under an Emergency Use Authorization (EUA). This EUA will remain  in effect (meaning this test can be used) for the duration of the COVID-19 declaration under Se ction 564(b)(1) of the Act, 21 U.S.C. section 360bbb-3(b)(1), unless the authorization is terminated or revoked sooner.  Performed at [facility], [place] [address], [place], [place] [address]     Procedures and diagnostic studies:  No results found.  Medications:    amLODipine  10 mg Oral BID   aspirin EC  81 mg Oral Daily   carvedilol  12.5 mg Oral BID   dexamethasone (DECADRON) injection  4 mg Intravenous Q8H   hydrALAZINE  10 mg Oral Q6H   insulin aspart  0-5 Units Subcutaneous QHS   insulin aspart  0-9 Units Subcutaneous TID WC   insulin aspart  3 Units Subcutaneous TID WC   pantoprazole  40 mg Oral Daily   pravastatin  40 mg Oral QHS   senna-docusate  2 tablet Oral Daily   sodium chloride flush  3 mL Intravenous Q12H   Continuous Infusions:  sodium  chloride       LOS: 3 days   Geradine Girt  [facility]   How to contact the [facility] Attending or Consulting provider [place] or covering provider during after hours [place], for this patient?  Check the care team in [facility] and look for a) attending/consulting [facility] provider listed and b) the [facility] team listed Log into [email or web address] and use [place]'s universal password to access. If you do not have the password, please contact the hospital operator. Locate the [facility] provider you are looking for under [facility] and page to a number that you can be directly reached. If you still have difficulty reaching the provider, please page the [facility] (Director on Call) for the Hospitalists listed on amion for assistance.  07/15/2021, 12:43 PM

## 2021-07-15 NOTE — Progress Notes (Signed)
Physical Therapy Treatment Patient Details Name: Lynn Wiley MRN: [id number] DOB: [birth year] Today's Date: 07/15/2021    History of Present Illness 65 y.o. female admitted with weakness. Imaging showed brain mets, no new CVA. Weakness thought to be 2* mass effect from cervical lesions.  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    PT Comments    Pt is progressing well with mobility. She ambulated 40' with RW, no loss of balance, distance limited by fatigue. Pt performed BLE strengthening exercises to minimize deconditioning during hospitalization, she was encouraged to perform these independently.     Follow Up Recommendations  Home health PT;Supervision for mobility/OOB     Equipment Recommendations  Wheelchair cushion (measurements PT);Wheelchair (measurements PT)    Recommendations for Other Services       Precautions / Restrictions Precautions Precautions: (P) Fall Precaution Comments: (P) 2 falls in past 1 week prior to admission    Mobility  Bed Mobility Overal bed mobility: Needs Assistance Bed Mobility: Supine to Sit     Supine to sit: HOB elevated;Min assist     General bed mobility comments: used rail, min A to raise trunk    Transfers Overall transfer level: Needs assistance Equipment used: Rolling walker (2 wheeled) Transfers: Sit to/from Stand Sit to Stand: From elevated surface;Min assist         General transfer comment: VCs hand placement, min A to power up  Ambulation/Gait Ambulation/Gait assistance: Min guard Gait Distance (Feet): 30 Feet Assistive device: Rolling walker (2 wheeled) Gait Pattern/deviations: Step-through pattern;Decreased stride length Gait velocity: decr   General Gait Details: distance limited by fatigue   Stairs             Wheelchair Mobility    Modified Rankin (Stroke Patients Only)       Balance Overall balance  assessment: History of Falls;Needs assistance Sitting-balance support: No upper extremity supported;Feet unsupported Sitting balance-Leahy Scale: Poor Sitting balance - Comments: posterior lean when unsupported   Standing balance support: Bilateral upper extremity supported Standing balance-Leahy Scale: Poor Standing balance comment: relies on BUE support                            Cognition Arousal/Alertness: Awake/alert Behavior During Therapy: WFL for tasks assessed/performed Overall Cognitive Status: Within Functional Limits for tasks assessed                                        Exercises General Exercises - Lower Extremity Ankle Circles/Pumps: AROM;Both;10 reps;Supine Quad Sets: AROM;Both;5 reps;Supine Long Arc Quad: AROM;Both;10 reps;Seated Hip Flexion/Marching: AROM;Both;10 reps;Seated    General Comments        Pertinent Vitals/Pain Pain Assessment: No/denies pain    Home Living                      Prior Function            PT Goals (current goals can now be found in the care plan section) Acute Rehab PT Goals Patient Stated Goal: to get stronger, return home PT Goal Formulation: With patient/family Time For Goal Achievement: 07/27/21 Potential to Achieve Goals: Fair Progress towards PT goals: Progressing toward goals    Frequency    Min 3X/week      PT Plan  Current plan remains appropriate    Co-evaluation              AM-PAC PT "6 Clicks" Mobility   Outcome Measure  Help needed turning from your back to your side while in a flat bed without using bedrails?: A Little Help needed moving from lying on your back to sitting on the side of a flat bed without using bedrails?: A Little Help needed moving to and from a bed to a chair (including a wheelchair)?: A Little Help needed standing up from a chair using your arms (e.g., wheelchair or bedside chair)?: A Little Help needed to walk in hospital room?: A  Little Help needed climbing 3-5 steps with a railing? : A Little 6 Click Score: 18    End of Session Equipment Utilized During Treatment: Gait belt Activity Tolerance: Patient limited by fatigue Patient left: in chair;with call bell/phone within reach;with family/visitor present;with chair alarm set Nurse Communication: Mobility status PT Visit Diagnosis: Difficulty in walking, not elsewhere classified (R26.2);Pain;History of falling (Z91.81)     Time: 8563-1497 PT Time Calculation (min) (ACUTE ONLY): 23 min  Charges:  $Gait Training: 8-22 mins $Therapeutic Exercise: 8-22 mins                     Blondell Reveal Kistler PT 07/15/2021  Acute Rehabilitation Services Pager [phone] Office [phone]

## 2021-07-16 ENCOUNTER — Ambulatory Visit
Admission: RE | Admit: 2021-07-16 | Discharge: 2021-07-16 | Disposition: A | Discharge status: Home / Self Care | Payer: Medicare Other | Source: Ambulatory Visit | Attending: Radiation Oncology | Admitting: Radiation Oncology

## 2021-07-16 ENCOUNTER — Other Ambulatory Visit (HOSPITAL_COMMUNITY): Payer: Self-pay

## 2021-07-16 DIAGNOSIS — Z510 Encounter for antineoplastic radiation therapy: Secondary | ICD-10-CM | POA: Insufficient documentation

## 2021-07-16 DIAGNOSIS — C7951 Secondary malignant neoplasm of bone: Secondary | ICD-10-CM | POA: Insufficient documentation

## 2021-07-16 DIAGNOSIS — C7931 Secondary malignant neoplasm of brain: Secondary | ICD-10-CM | POA: Insufficient documentation

## 2021-07-16 DIAGNOSIS — C801 Malignant (primary) neoplasm, unspecified: Secondary | ICD-10-CM | POA: Insufficient documentation

## 2021-07-16 LAB — GLUCOSE, CAPILLARY
Glucose-Capillary: 171 mg/dL — ABNORMAL HIGH (ref 70–99)
Glucose-Capillary: 239 mg/dL — ABNORMAL HIGH (ref 70–99)
Glucose-Capillary: 192 mg/dL — ABNORMAL HIGH (ref 70–99)
Glucose-Capillary: 264 mg/dL — ABNORMAL HIGH (ref 70–99)

## 2021-07-16 MED ORDER — INSULIN PEN NEEDLE 32G X 4 MM MISC
1 refills | Status: AC
  Filled 2021-07-16 (×2): qty 100, 25d supply, fill #0

## 2021-07-16 MED ORDER — GERHARDT'S BUTT CREAM: 1.0000 "application " | TOPICAL_CREAM | Freq: Four times a day (QID) | CUTANEOUS | 0 refills | Status: AC

## 2021-07-16 MED ORDER — FREESTYLE LIBRE 2 SENSOR MISC: 2 refills | Status: AC

## 2021-07-16 MED ORDER — INSULIN GLARGINE-YFGN 100 UNIT/ML ~~LOC~~ SOLN: 12.0000 [IU] | Freq: Every day | SUBCUTANEOUS | Status: DC

## 2021-07-16 MED ORDER — INSULIN LISPRO (1 UNIT DIAL) 100 UNIT/ML (KWIKPEN)
3.0000 [IU] | PEN_INJECTOR | Freq: Three times a day (TID) | SUBCUTANEOUS | 11 refills | Status: AC
  Filled 2021-07-16: qty 9, 30d supply, fill #0

## 2021-07-16 MED ORDER — PANTOPRAZOLE SODIUM 40 MG PO TBEC
40.0000 mg | DELAYED_RELEASE_TABLET | Freq: Every day | ORAL | 0 refills | Status: AC
  Filled 2021-07-16: qty 30, 30d supply, fill #0

## 2021-07-16 MED ORDER — INSULIN GLARGINE-YFGN 100 UNIT/ML ~~LOC~~ SOPN
12.0000 [IU] | PEN_INJECTOR | Freq: Every day | SUBCUTANEOUS | 0 refills | Status: DC
  Filled 2021-07-16: qty 15, 125d supply, fill #0

## 2021-07-16 MED ORDER — FREESTYLE LIBRE 2 READER DEVI: 1.0000 [IU] | Freq: Every day | 0 refills | Status: AC

## 2021-07-16 MED ORDER — ACETAMINOPHEN 325 MG PO TABS: 650.0000 mg | ORAL_TABLET | Freq: Four times a day (QID) | ORAL | Status: AC | PRN

## 2021-07-16 MED ORDER — GERHARDT'S BUTT CREAM
TOPICAL_CREAM | Freq: Four times a day (QID) | CUTANEOUS | Status: DC
  Filled 2021-07-16: qty 1

## 2021-07-16 MED ORDER — INSULIN ASPART 100 UNIT/ML FLEXPEN
PEN_INJECTOR | SUBCUTANEOUS | 11 refills | Status: DC
  Filled 2021-07-16: qty 9, 30d supply, fill #0

## 2021-07-16 MED ORDER — INSULIN GLARGINE 100 UNIT/ML SOLOSTAR PEN
12.0000 [IU] | PEN_INJECTOR | Freq: Every day | SUBCUTANEOUS | 11 refills | Status: AC
  Filled 2021-07-16: qty 3, 25d supply, fill #0

## 2021-07-16 NOTE — Progress Notes (Signed)
   07/16/21 1510  PT Visit Information  Reason Eval/Treat Not Completed Patient at procedure or test/unavailable (Pt unavailable, currently at CT. PT will follow up as schedule allows.)   Festus Barren., PT, DPT  Acute Rehabilitation Services  Office [phone]

## 2021-07-16 NOTE — Progress Notes (Signed)
Pt discharged to home. Went over AVS with pt and her daughter. They voiced understanding. Prescription for butt cream given to pt. Grandson driving her and her daughter home.

## 2021-07-16 NOTE — Care Management Important Message (Signed)
Medicare IM printed for [place] [facility] to give to the patient. 

## 2021-07-16 NOTE — Discharge Summary (Signed)
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  Diet recommendation: Low sodium, heart healthy.  Carbohydrate-modified.  Wound care: see below  Code status: Full.   History of Present Illness:   Cathi Hazan is a 65 y.o. 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  Secondary to metastatic lung cancer. --Radiation oncology and oncology consultations: Once radiation is completed, she will receive systemic chemotherapy by Dr. Julien Nordmann.  He will outline a detailed regimen with the patient and her daughter at her next visit.  The patient was scheduled to see him  in our office tomorrow.  I have canceled this appointment and asked for a new appointment to be rescheduled in approximately 2 weeks. -simulation planned for Friday- home after? -will need 14 treatments of radiation   Essential hypertension -- Stable   Hyperglycemia, hemoglobin A1c 8, may be drug-induced secondary to prolonged prednisone.   -- long term insulin and SSI   Metastatic lung cancer -- As per pulmonology/oncology    TSH low --asymptomatic -T4 normal   Neurofibromatosis  --stable   Skin Assessment: I agree with the wound assessment as performed by the wound care RN as outlined below: Pressure Injury 07/13/21 Buttocks Medial Stage 2 -  Partial thickness loss of dermis presenting as a shallow open injury with a red, pink wound bed without slough. pink read (Active)  07/13/21 0030  Location: Buttocks  Location Orientation: Medial  Staging: Stage 2 -  Partial thickness loss of dermis presenting as a shallow open injury with a red, pink wound bed without slough.  Wound Description (Comments): pink read  Present on Admission: Yes     Pressure Injury 07/13/21 Buttocks (Active)  07/13/21 0050  Location: Buttocks  Location Orientation:   Staging:   Wound Description (Comments):   Present on Admission:         Medical Consultants:   Rad onc oncology   Discharge Exam:   Vitals:   07/16/21 1324 07/16/21 1326  BP: 134/84 134/84  Pulse:  78  Resp:  16  Temp:  99 F (37.2 C)  SpO2:  98%   Vitals:  07/16/21 0517 07/16/21 0931 07/16/21 1324 07/16/21 1326  BP: 138/82 (!) 143/84 134/84 134/84  Pulse: 77 83  78  Resp: 16   16  Temp: 98.4 F (36.9 C)   99 F (37.2 C)  TempSrc: Oral     SpO2: 98%   98%    General exam: Appears calm and comfortable.    The results of significant diagnostics from this hospitalization (including imaging, microbiology, ancillary and laboratory) are listed below for reference.     Procedures and Diagnostic Studies:   No  results found.   Labs:   Basic Metabolic Panel: Recent Labs  Lab 07/12/21 1420 07/13/21 0456  NA 142 140  K 4.2 3.9  CL 109 108  CO2 25 23  GLUCOSE 282* 223*  BUN 33* 32*  CREATININE 0.61 0.46  CALCIUM 8.2* 8.4*  MG  --  1.9  PHOS  --  3.6   GFR Estimated Creatinine Clearance: 56 mL/min (by C-G formula based on SCr of 0.46 mg/dL). Liver Function Tests: Recent Labs  Lab 07/13/21 0456  AST 12*  ALT 18  ALKPHOS 49  BILITOT 0.5  PROT 5.0*  ALBUMIN 2.6*   No results for input(s): LIPASE, AMYLASE in the last 168 hours. No results for input(s): AMMONIA in the last 168 hours. Coagulation profile No results for input(s): INR, PROTIME in the last 168 hours.  CBC: Recent Labs  Lab 07/12/21 1420 07/13/21 0456  WBC 12.6* 11.1*  NEUTROABS  --  10.3*  HGB 12.6 12.0  HCT 38.6 38.8  MCV 92.8 96.5  PLT 127* 118*   Cardiac Enzymes: No results for input(s): CKTOTAL, CKMB, CKMBINDEX, TROPONINI in the last 168 hours. BNP: Invalid input(s): POCBNP CBG: Recent Labs  Lab 07/15/21 0856 07/15/21 1206 07/15/21 1635 07/15/21 2022 07/16/21 0744  GLUCAP 245* 216* 171* 239* 192*   D-Dimer No results for input(s): DDIMER in the last 72 hours. Hgb A1c No results for input(s): HGBA1C in the last 72 hours. Lipid Profile No results for input(s): CHOL, HDL, LDLCALC, TRIG, CHOLHDL, LDLDIRECT in the last 72 hours. Thyroid function studies No results for input(s): TSH, T4TOTAL, T3FREE, THYROIDAB in the last 72 hours.  Invalid input(s): FREET3 Anemia work up No results for input(s): VITAMINB12, FOLATE, FERRITIN, TIBC, IRON, RETICCTPCT in the last 72 hours. Microbiology Recent Results (from the past 240 hour(s))  SARS CORONAVIRUS 2 (TAT 6-24 HRS) Nasopharyngeal Nasopharyngeal Swab     Status: None   Collection Time: 07/12/21  9:11 PM   Specimen: Nasopharyngeal Swab  Result Value Ref Range Status   SARS Coronavirus 2 NEGATIVE NEGATIVE Final    Comment: (NOTE) SARS-CoV-2  target nucleic acids are NOT DETECTED.  The SARS-CoV-2 RNA is generally detectable in upper and lower respiratory specimens during the acute phase of infection. Negative results do not preclude SARS-CoV-2 infection, do not rule out co-infections with other pathogens, and should not be used as the sole basis for treatment or other patient management decisions. Negative results must be combined with clinical observations, patient history, and epidemiological information. The expected result is Negative.  Fact Sheet for Patients: SugarRoll.be  Fact Sheet for Healthcare Providers: https://www.woods-mathews.com/  This test is not yet approved or cleared by the [place] FDA and  has been authorized for detection and/or diagnosis of SARS-CoV-2 by FDA under an Emergency Use Authorization (EUA). This EUA will remain  in effect (meaning this test can be used) for the duration of the COVID-19 declaration under Se ction 564(b)(1) of the Act,  21 U.S.C. section 360bbb-3(b)(1), unless the authorization is terminated or revoked sooner.  Performed at [facility], [place] [address] [place], [place] [address]      Discharge Instructions:   Discharge Instructions     Diet - low sodium heart healthy   Complete by: As directed    Diet Carb Modified   Complete by: As directed    Discharge wound care:   Complete by: As directed    Small foam dressing to buttock wounds, change Q 3 days or PRN soiling. (Do not use larger sacrum dressing, it was trapping stool underneath the dressing.)   Increase activity slowly   Complete by: As directed       Allergies as of 07/16/2021   No Known Allergies      Medication List     STOP taking these medications    acetaminophen 650 MG CR tablet Commonly known as: TYLENOL Replaced by: acetaminophen 325 MG tablet   ADVIL PM PO   LORazepam 0.5 MG tablet Commonly known as: ATIVAN   omeprazole 20 MG  capsule Commonly known as: PRILOSEC Replaced by: pantoprazole 40 MG tablet   oxyCODONE-acetaminophen 5-325 MG tablet Commonly known as: Percocet       TAKE these medications    acetaminophen 325 MG tablet Commonly known as: TYLENOL Take 2 tablets (650 mg total) by mouth every 6 (six) hours as needed for mild pain (or Fever >/= 101). Replaces: acetaminophen 650 MG CR tablet   amLODipine 10 MG tablet Commonly known as: NORVASC Take 10 mg by mouth 2 (two) times daily.   Aspirin Low Dose 81 MG EC tablet Generic drug: aspirin Take 81 mg by mouth daily.   carvedilol 12.5 MG tablet Commonly known as: COREG Take 12.5 mg by mouth 2 (two) times daily.   dexamethasone 4 MG tablet Commonly known as: DECADRON Take 1 tablet (4 mg total) by mouth 3 (three) times daily. Take with food.   Fish Oil 1200 MG Caps Take 1,200 mg by mouth daily.   FreeStyle Libre 2 Reader Devi 1 Units by Does not apply route daily.   FreeStyle Libre 2 Sensor Misc To use with monitor   Gerhardt's butt cream Crea Apply 1 application topically 4 (four) times daily.   HumaLOG KwikPen 100 UNIT/ML KwikPen Generic drug: insulin lispro Inject 3 units into the skin 3 times daily as directed with meals if eating 50%. PLUS  SSI: CBG 121-150: 1 unit CBG 151-200: 2 units CBG 201-250: 3 units CBG 251-300: 5 units CBG 301-350: 7 units CBG 351-400: 9 units   Lantus SoloStar 100 UNIT/ML Solostar Pen Generic drug: insulin glargine Inject 12 Units into the skin daily.   pantoprazole 40 MG tablet Commonly known as: PROTONIX Take 1 tablet (40 mg total) by mouth daily. Start taking on: July 17, 2021 Replaces: omeprazole 20 MG capsule   pravastatin 40 MG tablet Commonly known as: PRAVACHOL Take 40 mg by mouth at bedtime.   sennosides-docusate sodium 8.6-50 MG tablet Commonly known as: SENOKOT-S Take 2 tablets by mouth daily.   Unifine Pentips 32G X 4 MM Misc Generic drug: Insulin Pen Needle Use with insulin  pens as directed.   vitamin C 500 MG tablet Commonly known as: ASCORBIC ACID Take 500 mg by mouth daily.               Durable Medical Equipment  (From admission, onward)           Start  Ordered   07/14/21 0858  For home use only DME lightweight manual wheelchair with seat cushion  Once       Comments: Patient suffers from weakness which impairs their ability to perform daily activities like dressing in the home.  A walker will not resolve  issue with performing activities of daily living. A wheelchair will allow patient to safely perform daily activities. Patient is not able to propel themselves in the home using a standard weight wheelchair due to arm weakness. Patient can self propel in the lightweight wheelchair. Length of need Lifetime. Accessories: elevating leg rests (ELRs), wheel locks, extensions and anti-tippers.  transport chair- has caregiver for propulsion   07/14/21 0858              Discharge Care Instructions  (From admission, onward)           Start     Ordered   07/16/21 0000  Discharge wound care:       Comments: Small foam dressing to buttock wounds, change Q 3 days or PRN soiling. (Do not use larger sacrum dressing, it was trapping stool underneath the dressing.)   07/16/21 1335            Follow-up Information     Marzetta Board, NP Follow up in 1 week(s).   Specialty: Adult Health Nurse Practitioner Contact information: [place] [address] [phone]                  Time coordinating discharge: 35 min  Signed:  Geradine Girt DO  [facility] 07/16/2021, 1:36 PM

## 2021-07-16 NOTE — Progress Notes (Signed)
Occupational Therapy Treatment Patient Details Name: Lynn Wiley MRN: [id number] DOB: 1956-05-27 Today's Date: 07/16/2021    History of present illness 65 y.o. female admitted with weakness. Imaging showed brain mets, no new CVA. Weakness thought to be 2* mass effect from cervical lesions.  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   OT comments  Patient requiring min up to max A for mobility this session. Patient able to mobilize legs to edge of bed but needing min A to push trunk to sitting due to limited strength in arms. Patient with 2 lateral loss of balance while scooting hips to edge of bed and unable to push from arm to upright trunk needing min A. Patient needing max cues, blocking of bilateral legs to ensure they do not slide forward and 3 attempts to power up to standing from edge of bed. Patient minimally pushing from arms on bed to stand and trying to repeatedly grab walker despite redirection. Once balanced, patient min A to take few steps to recliner with poor eccentric control into chair. Attempt to have patient brush her teeth/wash face in sitting but declines stating her daughter will do it when she gets to hospital. Patient reports family will be able to provide current assist levels for mobility/self care.    Follow Up Recommendations  Home health OT;Supervision/Assistance - 24 hour (if family can provide current level of assist needed for mobility)    Equipment Recommendations  Wheelchair (measurements OT);Wheelchair cushion (measurements OT)       Precautions / Restrictions Precautions Precautions: Fall Precaution Comments: 2 falls in past 1 week prior to admission       Mobility Bed Mobility Overal bed mobility: Needs Assistance Bed Mobility: Supine to Sit     Supine to sit: Min assist;HOB elevated     General bed mobility comments: has difficulty pushing through R  forearm to upright trunk    Transfers Overall transfer level: Needs assistance Equipment used: Rolling walker (2 wheeled) Transfers: Sit to/from [facility] Sit to Stand: Max assist Stand pivot transfers: Min assist       General transfer comment: please see toilet transfer in ADL section, needing max A to power up to standing from regular bed height. Patient states her family will be able to provide this level of assist at home    Balance Overall balance assessment: History of Falls;Needs assistance Sitting-balance support: Feet supported Sitting balance-Leahy Scale: Poor Sitting balance - Comments: has x2 lateral loss of balance at edge of bed needing assist to upright trunk   Standing balance support: Bilateral upper extremity supported Standing balance-Leahy Scale: Poor Standing balance comment: reliant on B UE of walker and min A to transfer to chair/ max A to stand                           ADL either performed or assessed with clinical judgement   ADL Overall ADL's : Needs assistance/impaired Eating/Feeding: Set up;Sitting Eating/Feeding Details (indicate cue type and reason): patient able to fruit with fingers, needed assist to open container   Grooming Details (indicate cue type and reason): declined trying to brush her teeth/wash face                 Toilet Transfer: Maximal assistance;Cueing for safety;Cueing for sequencing;Stand-pivot;RW Toilet Transfer Details (indicate cue type and reason): Patient needed max cues for  body mechanics to keep feet underneath her with tendency to slide R leg out in front. Also very minimal effort to push from bed with UE despite max cues, often trying to reach from walker. Needed 3 attempts with use of momentum and max A to power up to standing. then min A to take few steps to recliner. Poor eccentric control into chair                                   Cognition Arousal/Alertness:  Awake/alert Behavior During Therapy: WFL for tasks assessed/performed Overall Cognitive Status: Within Functional Limits for tasks assessed                                 General Comments: patient declining to try and perform ADL tasks such as brushing teeth or washing her face sitting in recliner "my daughter will do that" states her daughter did these things for her prior to hospitalization                          Pertinent Vitals/ Pain       Pain Assessment: Faces Faces Pain Scale: No hurt                                                Frequency  Min 2X/week        Progress Toward Goals  OT Goals(current goals can now be found in the care plan section)  Progress towards OT goals: Progressing toward goals  Acute Rehab OT Goals Patient Stated Goal: to get stronger, return home OT Goal Formulation: With patient/family Time For Goal Achievement: 07/27/21 Potential to Achieve Goals: Good ADL Goals Pt Will Perform Upper Body Dressing: with min assist;sitting Pt Will Perform Lower Body Dressing: with min assist;sit to/from stand;sitting/lateral leans Pt Will Transfer to Toilet: with min assist;bedside commode;stand pivot transfer;ambulating Pt Will Perform Toileting - Clothing Manipulation and hygiene: with min assist;sitting/lateral leans;sit to/from stand Additional ADL Goal #1: Pt will complete bed mobility at min A level to prepare for EOB/OOB ADLs.  Plan Discharge plan remains appropriate                     AM-PAC OT "6 Clicks" Daily Activity     Outcome Measure   Help from another person eating meals?: A Little Help from another person taking care of personal grooming?: A Lot Help from another person toileting, which includes using toliet, bedpan, or urinal?: A Lot Help from another person bathing (including washing, rinsing, drying)?: A Lot Help from another person to put on and taking off regular upper body  clothing?: A Little Help from another person to put on and taking off regular lower body clothing?: A Lot 6 Click Score: 14    End of Session Equipment Utilized During Treatment: Gait belt;Rolling walker  OT Visit Diagnosis: Unsteadiness on feet (R26.81);Muscle weakness (generalized) (M62.81);Pain;Other symptoms and signs involving the nervous system (R29.898)   Activity Tolerance Patient limited by fatigue   Patient Left in chair;with call bell/phone within reach;with chair alarm set   Nurse Communication Mobility status        Time: 0626-9485 OT Time Calculation (min): 15  min  Charges: OT General Charges $OT Visit: 1 Visit OT Treatments $Self Care/Home Management : 8-22 mins  Delbert Phenix OT OT pager: [place] 07/16/2021, 9:20 AM

## 2021-07-16 NOTE — Consult Note (Addendum)
WOC Nurse Consult Note: Consult requested for buttocks. Pt is frequently incontinent of urine and stool and it is difficult to keep the affected areas from becoming soiled.  She currently has a Purewick in place to attempt to contain the urine. Upper inner buttock with Stage 2 pressure injury; 1X1X.1cm, pink and moist Lower inner buttock with full thickness wound related to moisture; 1X1X.1cm, red and moist Inner perineum and area near rectum with moist macerated skin and patchy areas of partial thickness skin loss; painful to touch, appearance is consistent with moisture associated skin damage.  Pressure Injury POA: Yes Dressing procedure/placement/frequency: Discussed plan of care with pt's daughter at the bedside and she assessed the wound appearance. Topical treatment orders provided for bedside nurses to perform as follows to protect and repel moisture: Small foam dressing to buttock wounds, change Q 3 days or PRN soiling. (Do not use larger sacrum dressing, it was trapping stool underneath the dressing.) Please re-consult if further assistance is needed.  Thank-you,  Julien Girt MSN, [place], [place], [place], [place]

## 2021-07-19 ENCOUNTER — Ambulatory Visit: Payer: Medicare Other | Admitting: Adult Health

## 2021-07-19 ENCOUNTER — Other Ambulatory Visit: Payer: Self-pay

## 2021-07-19 ENCOUNTER — Ambulatory Visit: Payer: Medicare Other | Admitting: Radiation Oncology

## 2021-07-19 ENCOUNTER — Other Ambulatory Visit: Payer: Self-pay | Admitting: Radiation Oncology

## 2021-07-19 ENCOUNTER — Ambulatory Visit
Admission: RE | Admit: 2021-07-19 | Discharge: 2021-07-19 | Disposition: A | Discharge status: Home / Self Care | Payer: Medicare Other | Source: Ambulatory Visit | Attending: Radiation Oncology | Admitting: Radiation Oncology

## 2021-07-19 DIAGNOSIS — Z510 Encounter for antineoplastic radiation therapy: Secondary | ICD-10-CM | POA: Diagnosis not present

## 2021-07-19 DIAGNOSIS — C7951 Secondary malignant neoplasm of bone: Secondary | ICD-10-CM | POA: Diagnosis not present

## 2021-07-19 DIAGNOSIS — C7931 Secondary malignant neoplasm of brain: Secondary | ICD-10-CM | POA: Diagnosis not present

## 2021-07-19 DIAGNOSIS — C801 Malignant (primary) neoplasm, unspecified: Secondary | ICD-10-CM | POA: Diagnosis not present

## 2021-07-19 LAB — GLUCOSE, CAPILLARY: Glucose-Capillary: 199 mg/dL — ABNORMAL HIGH (ref 70–99)

## 2021-07-19 MED ORDER — DEXAMETHASONE 4 MG PO TABS: 4.0000 mg | ORAL_TABLET | Freq: Three times a day (TID) | ORAL | 1 refills | Status: AC

## 2021-07-20 ENCOUNTER — Ambulatory Visit: Payer: Medicare Other

## 2021-07-20 ENCOUNTER — Encounter: Payer: Self-pay | Admitting: Radiation Oncology

## 2021-07-20 ENCOUNTER — Ambulatory Visit
Admission: RE | Admit: 2021-07-20 | Discharge: 2021-07-20 | Disposition: A | Discharge status: Home / Self Care | Payer: Medicare Other | Source: Ambulatory Visit | Attending: Radiation Oncology | Admitting: Radiation Oncology

## 2021-07-20 DIAGNOSIS — C7931 Secondary malignant neoplasm of brain: Secondary | ICD-10-CM | POA: Insufficient documentation

## 2021-07-21 ENCOUNTER — Other Ambulatory Visit: Payer: Self-pay

## 2021-07-21 ENCOUNTER — Ambulatory Visit
Admission: RE | Admit: 2021-07-21 | Discharge: 2021-07-21 | Disposition: A | Discharge status: Home / Self Care | Payer: Medicare Other | Source: Ambulatory Visit | Attending: Radiation Oncology | Admitting: Radiation Oncology

## 2021-07-21 ENCOUNTER — Ambulatory Visit: Payer: Medicare Other

## 2021-07-21 DIAGNOSIS — C7931 Secondary malignant neoplasm of brain: Secondary | ICD-10-CM | POA: Diagnosis not present

## 2021-07-22 ENCOUNTER — Encounter: Payer: Self-pay | Admitting: Genetic Counselor

## 2021-07-22 ENCOUNTER — Ambulatory Visit: Payer: Medicare Other

## 2021-07-22 ENCOUNTER — Ambulatory Visit
Admission: RE | Admit: 2021-07-22 | Discharge: 2021-07-22 | Disposition: A | Discharge status: Home / Self Care | Payer: Medicare Other | Source: Ambulatory Visit | Attending: Radiation Oncology | Admitting: Radiation Oncology

## 2021-07-22 DIAGNOSIS — C7931 Secondary malignant neoplasm of brain: Secondary | ICD-10-CM | POA: Insufficient documentation

## 2021-07-22 DIAGNOSIS — C7951 Secondary malignant neoplasm of bone: Secondary | ICD-10-CM | POA: Diagnosis not present

## 2021-07-22 DIAGNOSIS — Z1379 Encounter for other screening for genetic and chromosomal anomalies: Secondary | ICD-10-CM | POA: Insufficient documentation

## 2021-07-22 DIAGNOSIS — Z510 Encounter for antineoplastic radiation therapy: Secondary | ICD-10-CM | POA: Diagnosis not present

## 2021-07-22 DIAGNOSIS — C801 Malignant (primary) neoplasm, unspecified: Secondary | ICD-10-CM | POA: Diagnosis not present

## 2021-07-23 ENCOUNTER — Ambulatory Visit: Payer: Self-pay | Admitting: Genetic Counselor

## 2021-07-23 ENCOUNTER — Ambulatory Visit
Admission: RE | Admit: 2021-07-23 | Discharge: 2021-07-23 | Disposition: A | Discharge status: Home / Self Care | Payer: Medicare Other | Source: Ambulatory Visit | Attending: Radiation Oncology | Admitting: Radiation Oncology

## 2021-07-23 ENCOUNTER — Ambulatory Visit: Payer: Medicare Other

## 2021-07-23 ENCOUNTER — Telehealth: Payer: Self-pay | Admitting: Genetic Counselor

## 2021-07-23 DIAGNOSIS — C7931 Secondary malignant neoplasm of brain: Secondary | ICD-10-CM | POA: Diagnosis not present

## 2021-07-23 DIAGNOSIS — Q8500 Neurofibromatosis, unspecified: Secondary | ICD-10-CM

## 2021-07-23 DIAGNOSIS — C3490 Malignant neoplasm of unspecified part of unspecified bronchus or lung: Secondary | ICD-10-CM

## 2021-07-23 DIAGNOSIS — Z1379 Encounter for other screening for genetic and chromosomal anomalies: Secondary | ICD-10-CM

## 2021-07-23 NOTE — Telephone Encounter (Signed)
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  There was not an explanation for the breast cancer in the family.  There is a chance that there is another gene that we did not test, or that the patient did not inherit a hereditary syndrome and that others in the family could have it.  It would be important to test someone on the paternal side of the family.  NF1 has been associated with an increased risk for cancer, and sometimes breast cancer.  However the breast cancer in the family is coming from the paternal side and the NF1 is coming from the maternal side.  Provided phone number for [facility] neurology to get into the NF clinic there.

## 2021-07-23 NOTE — Progress Notes (Signed)
GENETIC TEST RESULTS   Patient Name: Lynn Wiley Patient Age: 65 y.o. 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 Please refer to the prior Genetics clinic note for more information regarding Lynn Wiley's medical and family histories and our assessment at the time.   FAMILY HISTORY:  We obtained a detailed, 4-generation family history.  Significant diagnoses are listed below: Family History  Problem Relation Age of Onset   Neurofibromatosis Mother    Lung cancer Mother    Throat cancer Father        d. in his 16s   Neurofibromatosis Sister    Breast cancer Sister 80   Kidney cancer Brother        dx 4-60   Gastric cancer Brother        dx 21-60   Heart attack Brother    Neurofibromatosis Maternal Aunt    Neurofibromatosis Maternal Uncle    Breast cancer Paternal Aunt    Breast cancer Paternal Aunt    Cancer Paternal Uncle        NOS   Neurofibromatosis Maternal Grandmother    Cancer Cousin        possible ovarian cancer; 'abdominal cancer'    The patient has two daughters who reportedly do not have NF.  She has a maternal half sister and two full brothers.  Her sister had NF, and has a diagnosis of breast cancer.  Her daughter also has NF.  Both brothers are deceased.  One had kidney and gastric cancer and died at 41-60.  Both parents are deceased.   The patient's father died of throat cancer in his 41's.  He had eight siblings.  Two sisters had breast cancer and one has a daughter who had an abdominal cancer, possibly ovarian.  One of his brothers had a unknown cancer.  His parents are deceased for unknown reasons.   The patient's mother died of lung cancer.  She had NF1.  She had two brothers and a sister who all had NF1.  Both  maternal grandparents are deceased.  The grandmother had NF1.   Lynn Wiley is unaware of previous family history of genetic testing for hereditary cancer risks. Patient's maternal ancestors are of African American descent, and paternal ancestors are of African American descent. There is no reported Ashkenazi Jewish ancestry. There is no known consanguinity.  GENETIC TESTING:  At the time of Lynn Wiley's visit, we recommended she pursue genetic testing of the CancerNext-Expanded+RNAinsight test. 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 There were no deleterious mutations in AIP, ALK, APC*, ATM*, AXIN2, BAP1, BARD1, BLM, BMPR1A, BRCA1*, BRCA2*, BRIP1*, CDC73, CDH1*, CDK4, CDKN1B, CDKN2A, CHEK2*, CTNNA1, DICER1, FANCC, FH, FLCN, GALNT12, KIF1B, LZTR1, MAX, MEN1, MET, MLH1*, MSH2*, MSH3, MSH6*, MUTYH*, NBN, NF2, NTHL1, PALB2*, PHOX2B, PMS2*, POT1, PRKAR1A, PTCH1, PTEN*, RAD51C*, RAD51D*, RB1, RECQL, RET, SDHA, SDHAF2, SDHB, SDHC, SDHD, SMAD4, SMARCA4, SMARCB1, SMARCE1, STK11, SUFU, TMEM127, TP53*, TSC1, TSC2, VHL and XRCC2 (sequencing and deletion/duplication); EGFR, EGLN1, HOXB13, KIT, MITF, PDGFRA, POLD1, and POLE (sequencing only); EPCAM and GREM1 (deletion/duplication only). DNA and RNA analyses performed for * genes. .    Genetic  testing did identify a variant of uncertain significance (VUS) was identified in the LZTR1 gene called [id number].  At this time, it is unknown if this variant is associated with increased cancer risk or if this is a normal finding, but most variants such as this get reclassified to being inconsequential. It should not be used to make medical management decisions. With time, we suspect the lab will determine the significance of this variant, if any. If we do learn more about it, we will try to contact Lynn Wiley to  discuss it further. However, it is important to stay in touch with [place] periodically and keep the address and phone number up to date.  CLINICAL CONDITION: Neurofibromatosis type 1 is a condition that affects many systems in the body and is associated with an increase risk to develop several types of cancers and tumors.  Individuals may have patches of coffee-colored skin (cafe-au-lait spots), freckles in the underarms and groin areas, harmless bumps in the eyes (Lisch nodules) and non-cancerous tumors located on or just under the kin (neurofibromas).  There is also an increased chance for certain cancers including a type of eye cancer (optic nerve glioma), cancer of the tissues that cover the nerves (malignant peripheral nerve sheath tumors or MPNST), brain tumors, breast cancer, digestive tract tumors (gastrointestinal stromal tumors or GIST) and adrenal gland tumors (pheochromocytoma or [facility]). They may also have conditions affecting the heart and blood vessels.  Variations of this condition include neurofibromatosis-Noonan syndrome (NFNS) and Watson syndrome, which have similar features as neurofibromatosis type 1 as well as short stature, heart defects and other symptoms.    INHERITANCE: Hereditary predisposition to cancer due to pathogenic variants in the NF1 gene has autosomal dominant inheritance. This means that an individual with a pathogenic variant has a 50% chance of passing the condition on to his/her offspring. Most cases are inherited from a parent, but some cases may occur spontaneously (i.e., an individual with a pathogenic variant has parents who do not have it). Identification of a pathogenic variant allows for the recognition of at-risk relatives who can pursue testing for the familial variant.  MANAGEMENT Management Guidelines for individuals with pathogenic NF1 variants have been developed:  General health management Medical evaluation every year In childhood and adolescence,  evaluation should include measurement of head, growth rate, bone or scoliosis exams and monitoring for early-onset puberty Cutaneous neurofibromas Skin exam every year until young adulthood and then as needed Removal of lesions by a specialist can be considered Plexiform neurofibromas (PN)/Malignant peripheral nerve sheath tumors (MPNST) Education on the signs and symptoms of MPNST Imaging can be considered to assess the extent of PN at diagnosis. Imaging is recommended for individuals reporting signs or symptoms consistent with malignant transformation of a PN Optic Nerve Gliomas Eye exam at least every year from birth to age 50, then every 1-2 years from 8-20. Brain tumors and neurologic conditions Neurologic exam every year or more frequently if indicated Imagine is recommended for individuals with symptoms Cardiovascular conditions Clinical exam in infancy or at diagnosis to assess for heart defects. Blood pressure monitoring at least once per year beginning in early childhood Investigate and treat the underlying cause of high blood pressure. Pheochromocytoma [facility]) Individuals with symptoms of [facility] should undergo screening blood and urine tests. Testing may include plasma free metanephrines and /or 24-hour urine test for fractionated metanephrines. Breast cancer Mammogram with consideration of tomosynthesis (3D mammogram) every year starting at age 57. Consider breast MRI with contrast from ages  30-50. There is insufficient evidence to recommend risk-reducing mastectomy (surgery to remove the breasts) at this time.  However it may be considered on an individual basis, taking personal and family history into account. Gastrointestinal stomal tumors (GIST) There is insufficient evidence to recommend special screening for less common tumors (including GIST)in individuals without symptoms. Developmental and neuropsychological differences  Developmental and psychosocial evaluations beginning in  infancy Offer testing (neuropsychological and/or Psychoeducational) if there are academic or social concerns Screen for depression in adults Referrals to specialists and support groups as needed Pregnancy considerations  Pregnant individuals should be monitored more closely because there may be increased risk of high blood pressure and other complications.  FAMILY MEMBERS: It is important that all of Lynn Wiley's relatives (both men and women) know of the presence of this gene mutation. Site-specific genetic testing can sort out who in the family is at risk and who is not. It is advantageous to know if a NF1 pathogenic variant is present as medical management recommendations can be implemented. At-risk relatives can be identified, allowing pursuit of a diagnostic evaluation. In addition, the available information regarding hereditary cancer susceptibility genes is constantly evolving and more clinically relevant NF1 data is likely to become available in the near future. Awareness of this cancer predisposition allows patients and their providers to be vigilant in maintaining close and regular contact with their local genetics clinic in anticipation of new information, inform at-risk family members, and diligently follow condition-specific screening protocols.  Lynn Wiley children and siblings have a 50% chance to have inherited this mutation. We recommend they have genetic testing for this same mutation, as identifying the presence of this mutation would allow them to also take advantage of risk-reducing measures.   SUPPORT AND RESOURCES: Ms Wiley has not been followed specifically for her NF1 in several years.  Her daughter asked for information for an NF1 clinic.  I provided her information to the NF clinic at [facility] in [place], [place]. For genetic testing in relatives, to locate genetic counselors in other cities, visit the website of the [employer] ([employer]) and Secretary/administrator for  a Social worker by zip code.  We encouraged Lynn Wiley to remain in contact with [place] on an annual basis so we can update her personal and family histories, and let her know of advances in cancer genetics that may benefit the family. Our contact number was provided. Lynn Wiley questions were answered to her satisfaction today, and she knows she is welcome to call anytime with additional questions.   Sulaiman Imbert P. Florene Glen, [place], [facility] Licensed, Insurance risk surveyor [email or web address] phone: [phone]

## 2021-07-27 ENCOUNTER — Other Ambulatory Visit: Payer: Self-pay

## 2021-07-27 ENCOUNTER — Telehealth: Payer: Self-pay | Admitting: *Deleted

## 2021-07-27 ENCOUNTER — Ambulatory Visit
Admission: RE | Admit: 2021-07-27 | Discharge: 2021-07-27 | Disposition: A | Discharge status: Home / Self Care | Payer: Medicare Other | Source: Ambulatory Visit | Attending: Radiation Oncology | Admitting: Radiation Oncology

## 2021-07-27 ENCOUNTER — Ambulatory Visit: Payer: Medicare Other

## 2021-07-27 ENCOUNTER — Inpatient Hospital Stay (HOSPITAL_BASED_OUTPATIENT_CLINIC_OR_DEPARTMENT_OTHER): Payer: Medicare Other | Admitting: Internal Medicine

## 2021-07-27 ENCOUNTER — Inpatient Hospital Stay: Payer: Medicare Other | Attending: Internal Medicine

## 2021-07-27 ENCOUNTER — Other Ambulatory Visit: Payer: Self-pay | Admitting: Radiation Oncology

## 2021-07-27 VITALS — BP 145/85 | HR 80 | Temp 98.7°F | Resp 18 | Ht 64.0 in | Wt 116.0 lb

## 2021-07-27 DIAGNOSIS — C7931 Secondary malignant neoplasm of brain: Secondary | ICD-10-CM | POA: Diagnosis not present

## 2021-07-27 DIAGNOSIS — C7951 Secondary malignant neoplasm of bone: Secondary | ICD-10-CM

## 2021-07-27 DIAGNOSIS — Z923 Personal history of irradiation: Secondary | ICD-10-CM | POA: Insufficient documentation

## 2021-07-27 DIAGNOSIS — Z5112 Encounter for antineoplastic immunotherapy: Secondary | ICD-10-CM | POA: Insufficient documentation

## 2021-07-27 DIAGNOSIS — R918 Other nonspecific abnormal finding of lung field: Secondary | ICD-10-CM

## 2021-07-27 DIAGNOSIS — Z5111 Encounter for antineoplastic chemotherapy: Secondary | ICD-10-CM

## 2021-07-27 DIAGNOSIS — C3492 Malignant neoplasm of unspecified part of left bronchus or lung: Secondary | ICD-10-CM | POA: Diagnosis not present

## 2021-07-27 DIAGNOSIS — C7972 Secondary malignant neoplasm of left adrenal gland: Secondary | ICD-10-CM | POA: Insufficient documentation

## 2021-07-27 DIAGNOSIS — C3412 Malignant neoplasm of upper lobe, left bronchus or lung: Secondary | ICD-10-CM | POA: Insufficient documentation

## 2021-07-27 DIAGNOSIS — Z79899 Other long term (current) drug therapy: Secondary | ICD-10-CM | POA: Insufficient documentation

## 2021-07-27 LAB — CMP (CANCER CENTER ONLY)
ALT: 24 U/L (ref 0–44)
AST: 8 U/L — ABNORMAL LOW (ref 15–41)
Albumin: 2.5 g/dL — ABNORMAL LOW (ref 3.5–5.0)
Alkaline Phosphatase: 58 U/L (ref 38–126)
Anion gap: 13 (ref 5–15)
BUN: 30 mg/dL — ABNORMAL HIGH (ref 8–23)
CO2: 20 mmol/L — ABNORMAL LOW (ref 22–32)
Calcium: 8.2 mg/dL — ABNORMAL LOW (ref 8.9–10.3)
Chloride: 109 mmol/L (ref 98–111)
Creatinine: 0.73 mg/dL (ref 0.44–1.00)
GFR, Estimated: >60 mL/min (ref >60)
Glucose, Bld: 274 mg/dL — ABNORMAL HIGH (ref 70–99)
Potassium: 4.3 mmol/L (ref 3.5–5.1)
Sodium: 142 mmol/L (ref 135–145)
Total Bilirubin: 0.5 mg/dL (ref 0.3–1.2)
Total Protein: 5.1 g/dL — ABNORMAL LOW (ref 6.5–8.1)

## 2021-07-27 LAB — CBC WITH DIFFERENTIAL (CANCER CENTER ONLY)
Abs Immature Granulocytes: 0.47 10*3/uL — ABNORMAL HIGH (ref 0.00–0.07)
Basophils Absolute: 0.1 10*3/uL (ref 0.0–0.1)
Basophils Relative: 1 %
Eosinophils Absolute: 0 10*3/uL (ref 0.0–0.5)
Eosinophils Relative: 0 %
HCT: 34.5 % — ABNORMAL LOW (ref 36.0–46.0)
Hemoglobin: 11.5 g/dL — ABNORMAL LOW (ref 12.0–15.0)
Immature Granulocytes: 4 %
Lymphocytes Relative: 8 %
Lymphs Abs: 0.8 10*3/uL (ref 0.7–4.0)
MCH: 30.3 pg (ref 26.0–34.0)
MCHC: 33.3 g/dL (ref 30.0–36.0)
MCV: 91 fL (ref 80.0–100.0)
Monocytes Absolute: 0.3 10*3/uL (ref 0.1–1.0)
Monocytes Relative: 3 %
Neutro Abs: 9.3 10*3/uL — ABNORMAL HIGH (ref 1.7–7.7)
Neutrophils Relative %: 84 %
Platelet Count: 84 10*3/uL — ABNORMAL LOW (ref 150–400)
RBC: 3.79 MIL/uL — ABNORMAL LOW (ref 3.87–5.11)
RDW: 15 % (ref 11.5–15.5)
WBC Count: 11 10*3/uL — ABNORMAL HIGH (ref 4.0–10.5)
nRBC: 0 % (ref 0.0–0.2)

## 2021-07-27 MED ORDER — LIDOCAINE VISCOUS HCL 2 % MT SOLN: OROMUCOSAL | 2 refills | Status: AC

## 2021-07-27 MED ORDER — LIDOCAINE-PRILOCAINE 2.5-2.5 % EX CREA: TOPICAL_CREAM | CUTANEOUS | 0 refills | Status: AC

## 2021-07-27 MED ORDER — PROCHLORPERAZINE MALEATE 10 MG PO TABS: 10.0000 mg | ORAL_TABLET | Freq: Four times a day (QID) | ORAL | 0 refills | Status: AC | PRN

## 2021-07-27 NOTE — Telephone Encounter (Signed)
Speech Therapist Stormy Card with [facility] [phone] called to advise that she has attempted several times to reach patient to complete speech and swallow assessment.  She has left several messages and patient has not called her back yet.  She just wanted to make the provider aware that there is a delay in start of care as she is still attempting to reach the patient.

## 2021-07-27 NOTE — Progress Notes (Signed)
START ON PATHWAY REGIMEN - Small Cell Lung     Cycles 1 through 4: A cycle is every 21 days:     Durvalumab      Carboplatin      Etoposide    Cycles 5 and beyond: A cycle is every 28 days:     Durvalumab   **Always confirm dose/schedule in your pharmacy ordering system**  Patient Characteristics: Newly Diagnosed, Preoperative or Nonsurgical Candidate (Clinical Staging), First Line, Extensive Stage Therapeutic Status: Newly Diagnosed, Preoperative or Nonsurgical Candidate (Clinical Staging) AJCC T Category: cT2b AJCC N Category: cN2 AJCC M Category: cM1c AJCC 8 Stage Grouping: IVB Stage Classification: Extensive  Intent of Therapy: Non-Curative / Palliative Intent, Discussed with Patient

## 2021-07-27 NOTE — Progress Notes (Signed)
[facility] [phone]   [phone]) [phone]  OFFICE PROGRESS NOTE  Marzetta Board, NP [place] [address]  DIAGNOSIS: Extensive stage (T2 a, N2, M1 C) small cell lung cancer presented with left upper lobe lung mass in addition to mediastinal lymphadenopathy and destructive metastatic bone lesion involving the C6, C7 and T1 vertebrae as well as left adrenal mass and calvarial and intracranial metastatic lesions.  She also has epidural extension of the tumor at C7-T1 and T1-T2 with mild mass-effect on the thecal sac.  PRIOR THERAPY: Palliative radiotherapy to the brain and vertebral metastasis under the care of Dr. Isidore Moos expected to be completed on 08/06/2021.  CURRENT THERAPY: Systemic chemotherapy with carboplatin for AUC of 5 on day 1, etoposide 100 Mg/M2 on days 1, 2 and 3 with Cosela before chemotherapy and Imfinzi 1500 Mg IV every 3 weeks during the course of chemotherapy.  For his dose 08/14/2021.  INTERVAL HISTORY: Lynn Wiley 65 y.o. female returns to the clinic today for follow-up visit accompanied by her daughter.  The patient is feeling fine today with no concerning complaints except for being sleepy with more fatigue.  She denied having any current chest pain, shortness of breath, cough or hemoptysis.  She denied having any fever or chills.  She has no nausea, vomiting, diarrhea or constipation.  She has no headache or visual changes.  She has no significant weight loss or night sweats.  She is currently undergoing palliative radiotherapy to the metastatic disease in the brain, skull as well as the spinal cord under the care of Dr. Isidore Moos and expected to complete this course on 08/06/2021. The patient had several studies since her last visit including bronchoscopy with endobronchial ultrasound and biopsy under the care of Dr. Valeta Harms and the final pathology was consistent with small cell lung cancer.  She also underwent PET scan as  well as MRI of the brain and cervical spine.  The patient is here today for evaluation and discussion of her treatment options based on the recent staging work-up and final pathology.  MEDICAL HISTORY: Past Medical History:  Diagnosis Date   Cancer [facility])    COVID-19 06/22/2021   Family history of breast cancer    Family history of kidney cancer    Family history of neurofibromatosis    Hypertension    Neurofibromatosis ([place])    Stroke [facility])    Stroke ([place]) 06/23/2021    ALLERGIES:  has No Known Allergies.  MEDICATIONS:  Current Outpatient Medications  Medication Sig Dispense Refill   acetaminophen (TYLENOL) 325 MG tablet Take 2 tablets (650 mg total) by mouth every 6 (six) hours as needed for mild pain (or Fever >/= 101).     amLODipine (NORVASC) 10 MG tablet Take 10 mg by mouth 2 (two) times daily.     ASPIRIN LOW DOSE 81 MG EC tablet Take 81 mg by mouth daily.     carvedilol (COREG) 12.5 MG tablet Take 12.5 mg by mouth 2 (two) times daily.     Continuous Blood Gluc Receiver (FREESTYLE LIBRE 2 READER) DEVI 1 Units by Does not apply route daily. 1 each 0   Continuous Blood Gluc Sensor (FREESTYLE LIBRE 2 SENSOR) MISC To use with monitor 1 each 2   dexamethasone (DECADRON) 4 MG tablet Take 1 tablet (4 mg total) by mouth 3 (three) times daily. Take with food. 90 tablet 1   insulin glargine (LANTUS) 100 UNIT/ML Solostar Pen Inject 12  Units into the skin daily. 15 mL 11   insulin lispro (HUMALOG) 100 UNIT/ML KwikPen Inject 3 units into the skin 3 times daily as directed with meals if eating 50%. PLUS  SSI: CBG 121-150: 1 unit CBG 151-200: 2 units CBG 201-250: 3 units CBG 251-300: 5 units CBG 301-350: 7 units CBG 351-400: 9 units 15 mL 11   Insulin Pen Needle 32G X 4 MM MISC Use with insulin pens as directed. 200 each 1   lidocaine (XYLOCAINE) 2 % solution Patient: Mix 1part 2% viscous lidocaine, 1part H20. Swish & swallow 61mL of diluted mixture, 17min before meals and at bedtime, up to QID  200 mL 2   Nystatin (GERHARDT'S BUTT CREAM) CREA Apply 1 application topically 4 (four) times daily. 1 each 0   Omega-3 Fatty Acids (FISH OIL) 1200 MG CAPS Take 1,200 mg by mouth daily.     pantoprazole (PROTONIX) 40 MG tablet Take 1 tablet (40 mg total) by mouth daily. 30 tablet 0   pravastatin (PRAVACHOL) 40 MG tablet Take 40 mg by mouth at bedtime.     sennosides-docusate sodium (SENOKOT-S) 8.6-50 MG tablet Take 2 tablets by mouth daily.     vitamin C (ASCORBIC ACID) 500 MG tablet Take 500 mg by mouth daily.     No current facility-administered medications for this visit.    SURGICAL HISTORY:  Past Surgical History:  Procedure Laterality Date   BRONCHIAL BIOPSY  07/06/2021   Procedure: BRONCHIAL BIOPSIES;  Surgeon: Garner Nash, DO;  Location: [facility];  Service: Pulmonary;;   BRONCHIAL BRUSHINGS  07/06/2021   Procedure: BRONCHIAL BRUSHINGS;  Surgeon: Garner Nash, DO;  Location: [place] [facility];  Service: Pulmonary;;   BRONCHIAL NEEDLE ASPIRATION BIOPSY  07/06/2021   Procedure: BRONCHIAL NEEDLE ASPIRATION BIOPSIES;  Surgeon: Garner Nash, DO;  Location: [place] [facility];  Service: Pulmonary;;   NO PAST SURGERIES     VIDEO BRONCHOSCOPY WITH ENDOBRONCHIAL NAVIGATION Left 07/06/2021   Procedure: VIDEO BRONCHOSCOPY WITH ENDOBRONCHIAL NAVIGATION;  Surgeon: Garner Nash, DO;  Location: [facility];  Service: Pulmonary;  Laterality: Left;  ION   VIDEO BRONCHOSCOPY WITH RADIAL ENDOBRONCHIAL ULTRASOUND  07/06/2021   Procedure: VIDEO BRONCHOSCOPY WITH RADIAL ENDOBRONCHIAL ULTRASOUND;  Surgeon: Garner Nash, DO;  Location: [facility];  Service: Pulmonary;;    REVIEW OF SYSTEMS:  Constitutional: positive for fatigue Eyes: negative Ears, nose, mouth, throat, and face: negative Respiratory: positive for dyspnea on exertion Cardiovascular: negative Gastrointestinal: negative Genitourinary:negative Integument/breast: negative Hematologic/lymphatic:  negative Musculoskeletal:positive for muscle weakness Neurological: positive for weakness Behavioral/Psych: negative Endocrine: negative Allergic/Immunologic: negative   PHYSICAL EXAMINATION: General appearance: alert, cooperative, fatigued, and no distress Head: Normocephalic, without obvious abnormality, atraumatic Neck: no adenopathy, no JVD, supple, symmetrical, trachea midline, and thyroid not enlarged, symmetric, no tenderness/mass/nodules Lymph nodes: Cervical, supraclavicular, and axillary nodes normal. Resp: clear to auscultation bilaterally Back: symmetric, no curvature. ROM normal. No CVA tenderness. Cardio: regular rate and rhythm, S1, S2 normal, no murmur, click, rub or gallop GI: Normal Extremities: extremities normal, atraumatic, no cyanosis or edema Neurologic: Motor: Weakness in the lower extremities  ECOG PERFORMANCE STATUS: 1 - Symptomatic but completely ambulatory  Blood pressure (!) 145/85, pulse 80, temperature 98.7 F (37.1 C), temperature source Tympanic, resp. rate 18, SpO2 99 %.  LABORATORY DATA: Lab Results  Component Value Date   WBC 11.0 (H) 07/27/2021   HGB 11.5 (L) 07/27/2021   HCT 34.5 (L) 07/27/2021   MCV 91.0 07/27/2021   PLT 84 (L) 07/27/2021  Chemistry      Component Value Date/Time   NA 140 07/13/2021 0456   K 3.9 07/13/2021 0456   CL 108 07/13/2021 0456   CO2 23 07/13/2021 0456   BUN 32 (H) 07/13/2021 0456   CREATININE 0.46 07/13/2021 0456   CREATININE 0.83 06/15/2021 1044      Component Value Date/Time   CALCIUM 8.4 (L) 07/13/2021 0456   ALKPHOS 49 07/13/2021 0456   AST 12 (L) 07/13/2021 0456   AST 15 06/15/2021 1044   ALT 18 07/13/2021 0456   ALT 12 06/15/2021 1044   BILITOT 0.5 07/13/2021 0456   BILITOT 0.3 06/15/2021 1044       RADIOGRAPHIC STUDIES: CT HEAD WO CONTRAST (5MM)  Result Date: 07/12/2021 CLINICAL DATA:  Fall, rule out hemorrhage EXAM: CT HEAD WITHOUT CONTRAST TECHNIQUE: Contiguous axial images were  obtained from the base of the skull through the vertex without intravenous contrast. COMPARISON:  CT brain, 06/19/2021 MR brain, 07/02/2021 FINDINGS: Brain: No evidence of acute infarction, hemorrhage, hydrocephalus, extra-axial collection or mass lesion/mass effect. Extensive periventricular and deep white matter hypodensity with chronic lacunar infarctions of the bilateral corona radiata (series 2, image 19). Known left temporal metastases are not well appreciated by noncontrast CT. Vascular: No hyperdense vessel or unexpected calcification. Skull: Redemonstrated large partially calcified lytic lesion of the right parietal vertex (series 3, image 26) as well as a smaller lytic lesion involving the inner table of the more anterior right parietal lobe (series 3, image 22). Negative for fracture or focal lesion. Sinuses/Orbits: No acute finding. Other: None. IMPRESSION: 1. No acute intracranial pathology. 2. Advanced small-vessel white matter disease with chronic lacunar infarctions of the bilateral corona radiata. 3. Known left temporal metastases seen on prior contrast enhanced MR are not well appreciated by noncontrast CT. 4. Redemonstrated large partially calcified lytic lesion of the right parietal vertex as well as a smaller lytic lesion involving the inner table of the more anterior right parietal lobe. Electronically Signed   By: Eddie Candle M.D.   On: 07/12/2021 15:22   MR BRAIN WO CONTRAST  Result Date: 07/12/2021 CLINICAL DATA:  Lung cancer. Metastatic disease. New onset right-sided weakness. EXAM: MRI HEAD WITHOUT CONTRAST TECHNIQUE: Multiplanar, multiecho pulse sequences of the brain and surrounding structures were obtained without intravenous contrast. COMPARISON:  Eighty crest pattern radiology area on malignant this FINDINGS: Brain: T2 and FLAIR signal changes are again noted in the previously identified metastatic brain lesions. The largest is in the inferior left temporal lobe measuring 17 mm.  Additional lesions involving the left thalamus, posterior left temporal white matter in left frontal lobe are again noted. T2 signal changes associated with a lesion in the peripheral right cerebellum noted. Multiple remote lacunar infarcts are present in the periventricular white matter, right lentiform nucleus, right pons, and bilateral cerebellum without significant interval change. No acute infarct is present.  No acute hemorrhage is present. The internal auditory canals are within normal limits. Vascular: Flow is present in the major intracranial arteries. Skull and upper cervical spine: Metastatic lesions to the skull are stable. The largest lesion is in the right occipital skull, measuring over 6.5 cm in maximal dimension. Smaller lesion just anterior in the right parietal skull measures up to 17 mm. Multiple other smaller lesions are again seen. See MRI of the cervical spine same day for description of metastatic disease in the cervical spine. Craniocervical junction is within normal limits. Sinuses/Orbits: Right mastoid effusion is again noted. The paranasal sinuses and mastoid  air cells are otherwise clear. The globes and orbits are within normal limits. IMPRESSION: 1. Stable appearance of metastatic brain lesions. 2. Metastatic lesions to the skull are stable. 3. No acute intracranial abnormality to explain the new weakness. 4. Multiple remote lacunar infarcts involving the periventricular white matter, right lentiform nucleus, right pons, and bilateral cerebellum are stable. 5. Right mastoid effusion. Electronically Signed   By: San Morelle M.D.   On: 07/12/2021 18:08   MR BRAIN W WO CONTRAST  Addendum Date: 07/02/2021   ADDENDUM REPORT: 07/02/2021 20:17 ADDENDUM: Omitted finding of multiple subcutaneous nodules in the scalp, likely related to history of neurofibromatosis. Electronically Signed   By: Monte Fantasia M.D.   On: 07/02/2021 20:17   Result Date: 07/02/2021 CLINICAL DATA:   Follow-up brain lesion by head CT. EXAM: MRI HEAD WITHOUT AND WITH CONTRAST TECHNIQUE: Multiplanar, multiecho pulse sequences of the brain and surrounding structures were obtained without and with intravenous contrast. CONTRAST:  75mL GADAVIST GADOBUTROL 1 MMOL/ML IV SOLN COMPARISON:  Head CT from 06/19/2021 FINDINGS: Brain: Positive for intracranial metastases: *21 mm metastasis in the inferior left temporal lobe. *5 mm ring-enhancing metastasis in the left medial thalamus *3 mm ring-enhancing metastasis in the posterior left temporal white matter, see sagittal postcontrast images *Suspected 4 mm ring-enhancing metastasis in the deep left frontal white matter, see sagittal postcontrast images and axial diffusion images. *Notable subcentimeter nodular focus of weakly restricted diffusion in the right occipital white matter on axial images, not seen on postcontrast imaging. Chronic small vessel ischemia with gliosis and multiple chronic lacunar infarcts in the brainstem, deep gray nuclei, and deep white matter. No acute hemorrhage or hydrocephalus. Vascular: Preserved flow voids and vascular enhancements. Skull and upper cervical spine: Transcortical lytic lesion in the right parietal bone measuring 5.2 cm in diameter. There is contact of the superior sagittal sinus without visible invasion. More anterior 18 mm parietal bone metastasis which erodes the inner cortex and is indistinguishable from the adjacent dura. Small daughter lesions extend inferiorly from the largest right parietal bone metastasis. Sinuses/Orbits: Partial right mastoid opacification with negative nasopharynx. IMPRESSION: 1. At least 3 brain metastases measuring up to 21 mm in the inferior left temporal lobe. There may be 2 additional subcentimeter metastases based on diffusion imaging. 2. Lytic lesions in the right parietal bone extending to the dura. 3. Chronic small vessel disease with chronic lacunes. Electronically Signed: By: Monte Fantasia  M.D. On: 07/02/2021 19:39   MR Cervical Spine W or Wo Contrast  Result Date: 07/12/2021 CLINICAL DATA:  Metastatic disease evaluation EXAM: MRI CERVICAL SPINE WITHOUT AND WITH CONTRAST TECHNIQUE: Multiplanar and multiecho pulse sequences of the cervical spine, to include the craniocervical junction and cervicothoracic junction, were obtained without and with intravenous contrast. CONTRAST:  9mL GADAVIST GADOBUTROL 1 MMOL/ML IV SOLN COMPARISON:  06/07/2021 FINDINGS: Alignment: Reversal of the normal cervical lordosis. Trace anterolisthesis of C3 on C4. Vertebrae: Redemonstrated marrow replacing lesions in the C6 and T1 vertebral bodies, as well as the C5 and C7 vertebral bodies to a lesser extent, extending to the right pedicles and posterior elements. Again noted are bulky extraosseous soft tissue masses, which extend into the right foramina at C5-C6, C6-C7, C7-T1, and T1-T2. One total, the mass measures up to 5.9 x 3.2 x 3.6 cm (series 20, image 5 and series 16, image 27), previously 5.6 x 2.2 x 3.5 cm. The posterior aspect of the right first and second ribs are also likely involved. No pathologic fracture is seen.  Cord: Normal signal and morphology. Extraosseous extension of tumor posteriorly at C6, and on the right at C7 and T1, overall unchanged. No evidence of cord compression. Posterior Fossa, vertebral arteries, paraspinal tissues: Vertebral artery flow voids are maintained, although again noted is encasement of the right vertebral artery at the level of C6, as well as now at C5. Extension of the previously noted soft tissue masses in the right paraspinal soft tissues, as described above. Redemonstrated infarct in the pons. Disc levels: C2-C3: Disc osteophyte complex. Left greater than right facet arthropathy. No spinal canal stenosis. Moderate to severe left neural foraminal narrowing. C3-C4: Trace anterolisthesis with disc unroofing. Mild left greater than right facet arthropathy. No spinal canal  stenosis. No neural foraminal narrowing. C4-C5: Minimal disc bulge. Uncovertebral and facet arthropathy. No spinal canal stenosis or neural foraminal narrowing. C5-C6: Disc height loss with minimal disc bulge. Uncovertebral and facet arthropathy. No spinal canal stenosis. Tumor is again seen in the right neural foramen. No epidural extension of tumor at this level. C6-C7: Disc osteophyte. Facet and uncovertebral hypertrophy. Tumor is again noted in the right neural foramen. No spinal canal stenosis. No epidural extension of tumor. C7-T1: Complete occlusion of the right neural foramen secondary to tumor. There is epidural extension, with mass effect on the thecal sac but no spinal canal stenosis. The left neural foramen is unremarkable. The right T1-T2 neural foramen also appears completely occluded secondary to tumor. IMPRESSION: 1. Redemonstrated marrow replacing lesions in the C6 and T1 vertebral bodies, as well as the C5 and C7 vertebral bodies to a lesser extent, primarily involving the right aspect of the vertebral bodies, pedicles, and posterior elements. This has slightly increased in size compared to the prior exam. Epidural extension of tumor at C7-T1 and T1-T2, with mild mass effect on the thecal sac but no significant canal stenosis or cord signal abnormality. Associated extraosseous soft tissue in the right-sided foramina again noted at C5-C6, C6-C7, C7-T1, and T1-T2. 2. Redemonstrated encasement of the right vertebral artery at the C6 level, now with some encasement at C5. The flow void is maintained. Electronically Signed   By: Merilyn Baba M.D.   On: 07/12/2021 18:32   NM PET Image Initial (PI) Skull Base To Thigh  Result Date: 07/08/2021 CLINICAL DATA:  Initial treatment strategy for lung nodule. EXAM: NUCLEAR MEDICINE PET SKULL BASE TO THIGH TECHNIQUE: 5.6 mCi F-18 FDG was injected intravenously. Full-ring PET imaging was performed from the skull base to thigh after the radiotracer. CT data was  obtained and used for attenuation correction and anatomic localization. Fasting blood glucose: 170 mg/dl COMPARISON:  Chest CT 07/02/2021 and CT abdomen/pelvis from 06/09/2021 FINDINGS: Mediastinal blood pool activity: SUV max 3.0 Liver activity: SUV max 3.6 NECK: The left temporal lobe lesion is faintly seen on the top most CT image (image 1, series 4) but not well appreciated on the PET images. Likewise the calvarial lesions are not included. The destructive spinal lesions are discussed under the skeletal section below. Incidental CT findings: Right mastoid effusion. CHEST: The confluent triangular left upper lobe mass measures 3.9 by 3.2 cm on image 36 series 8 with maximum SUV 8.2, compatible with malignancy. AP window lymph node 0.8 cm in short axis on image 57 series 4, maximum SUV 3.3. Scattered sessile and exophytic cutaneous nodules are identified along the neck, chest, and abdomen, a representative exophytic presternal lesion measuring 1.3 by 1.1 cm has a maximum SUV of 2.7. These are probably neurofibromas. Incidental CT findings: Biapical  pleuroparenchymal scarring. Mild paraseptal emphysema. Mild scarring in the left lower lobe. The subtle clustered nodularity in the right upper lobe is roughly stable and not appreciably hypermetabolic. ABDOMEN/PELVIS: 2.4 by 2.8 cm left adrenal mass on image 100 series 4, maximum SUV 3.9. No fatty elements in this mass, precontrast density is 27 Hounsfield units. This is likely a metastatic lesion although has substantially less metabolic activity than the primary tumor. Scattered accentuated segmental metabolic activity in bowel, likely physiologic. Incidental CT findings: Potential wall thickening in the nondistended cecum, ascending colon, transverse colon, and proximal descending colon-colitis not excluded. Scattered colonic diverticula. Aortoiliac atherosclerotic vascular disease. Diverticulum of the proximal transverse duodenum. SKELETON: Hypermetabolic  destructive mass of the right C6 vertebral body as depicted on recent examinations, with involvement of the right posterior elements, maximum SUV 6.4, compatible with metastatic lesion. Expansile destructive lesion of the right T1 vertebral body with extension into the right neural foramen at C7-T1, involvement of right C7 and T1 posterior elements, and involvement of the right T1 transverse process with some posterior erosion of the right first rib, maximum SUV 12.7, compatible with metastatic lesion. No other hypermetabolic osseous metastatic lesions are identified. Incidental CT findings: none IMPRESSION: 1. Hypermetabolic left upper lobe mass with maximum SUV 8.2, with associated destructive hypermetabolic metastatic lesions involving the right C6, C7, and T1 vertebra as depicted on recent examinations. The left adrenal mass is only mildly hypermetabolic with maximum SUV of 3.9 but still remains suspicious for a metastatic lesion. 2. The calvarial and intracranial lesions are not well depicted on today's PET-CT. 3. Potential wall thickening in the nondistended cecum, ascending colon, transverse colon, and descending colon-colitis is not excluded. 4. No substantial accentuated metabolic activity in the subtle clustered nodularity in the right upper lobe which is probably inflammatory but merit surveillance. 5. 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 6. Other imaging findings of potential clinical significance: Right mastoid effusion. Mild paraseptal emphysema. Aortic Atherosclerosis (ICD10-I70.0) and Emphysema (ICD10-J43.9). Electronically Signed   By: Van Clines M.D.   On: 07/08/2021 10:58   DG CHEST PORT 1 VIEW  Result Date: 07/06/2021 CLINICAL DATA:  Post bronchoscopy. EXAM: PORTABLE CHEST 1 VIEW COMPARISON:  CT chest dated July 02, 2021. FINDINGS: The heart size and mediastinal contours are  within normal limits. Grossly unchanged left upper lobe mass. Mild right basilar atelectasis. No pleural effusion or pneumothorax. No acute osseous abnormality. IMPRESSION: 1. No pneumothorax status post bronchoscopy. 2. Grossly unchanged left upper lobe mass. Electronically Signed   By: Titus Dubin M.D.   On: 07/06/2021 09:18   CT Super D Chest Wo Contrast  Result Date: 07/05/2021 CLINICAL DATA:  Metastatic lung cancer EXAM: CT CHEST WITHOUT CONTRAST TECHNIQUE: Multidetector CT imaging of the chest was performed using thin slice collimation for electromagnetic bronchoscopy planning purposes, without intravenous contrast. COMPARISON:  CT chest dated 06/09/2021 FINDINGS: Cardiovascular: The heart is top-normal in size. No pericardial effusion. No evidence of thoracic aortic aneurysm. Atherosclerotic calcifications of the aortic arch. Mild coronary atherosclerosis of the LAD and left circumflex. Mediastinum/Nodes: 8 mm short axis subcarinal node, within normal limits. Visualized thyroid is unremarkable. Lungs/Pleura: 4.0 x 3.2 cm aggregate lesion in the left upper lobe (series 3/images 63 and 65), previously 3.5 x 3.0 cm when measured in a similar fashion. Adjacent 13 mm central left upper lobe nodule (series 3/image 62), previously 12 mm. This corresponds to the patient's known primary bronchogenic neoplasm. Biapical pleural-parenchymal scarring.  Faint peribronchovascular ground-glass and solid nodules in the right upper lobe (series 3/images 49 and 58), favoring infection/inflammation over metastatic disease. Mild subpleural scarring with volume loss in the left lower lobe. No focal consolidation. Mild paraseptal emphysematous changes, upper lung predominant. No pleural effusion or pneumothorax. Upper Abdomen: 2.5 x 2.5 cm left adrenal metastasis, incompletely visualized, previously 2.0 x 2.0 cm. Musculoskeletal: Destructive lytic metastasis involving the right T1 vertebral body and transverse process  (series 2/image 1), incompletely visualized, similar. IMPRESSION: 4.0 cm aggregate left upper lobe mass with adjacent 13 mm central left upper lobe nodule, corresponding to the patient's known primary bronchogenic neoplasm, mildly progressive. Faint peribronchovascular ground-glass and solid nodules in the right upper lobe, favoring infection/inflammation over metastatic disease. 2.5 cm left adrenal metastasis, incompletely visualized, progressive. Destructive lytic metastasis involving the right T1 vertebral body and transverse process, incompletely visualized, similar. Aortic Atherosclerosis (ICD10-I70.0) and Emphysema (ICD10-J43.9). Electronically Signed   By: Julian Hy M.D.   On: 07/05/2021 03:13   DG C-ARM BRONCHOSCOPY  Result Date: 07/06/2021 C-ARM BRONCHOSCOPY: Fluoroscopy was utilized by the requesting physician.  No radiographic interpretation.    ASSESSMENT AND PLAN: This is a very pleasant 65 years old African-American female recently diagnosed with extensive stage (T2 a, N2, M1 C) small cell lung cancer presented with left upper lobe lung mass in addition to mediastinal lymphadenopathy and metastatic disease to the brain as well as the cervical and thoracic spine in addition to the skull and left adrenal gland metastasis diagnosed in August 2022. The patient is currently undergoing palliative radiotherapy to the brain and cervical spine under the care of Dr. Isidore Moos and expected to complete this course on 08/06/2021. I had a lengthy discussion with the patient and her daughter today about her current disease stage, prognosis and treatment options. I explained to the patient that she has incurable condition and all the treatment will be of palliative nature. I will give the patient the option of palliative care and hospice referral versus consideration of palliative systemic chemotherapy with carboplatin for AUC of 5 on day 1 and 2 etoposide 100 Mg/M2 on days 1, 2 and 3 with Cosela 240  Mg/M2 before the chemotherapy as well as Imfinzi 1500 Mg IV on day 1 every 3 weeks during the course of the chemotherapy followed by maintenance treatment every 4 weeks after completion of the induction phase if there is no evidence for disease progression. I discussed with the patient the adverse effect of this treatment including but not limited to alopecia, myelosuppression, nausea and vomiting, peripheral neuropathy, liver or renal dysfunction as well as immunotherapy adverse effects. The patient is interested in proceeding with the systemic therapy.  She is expected to start the first cycle of this treatment on 08/10/2021. The patient will have a chemotherapy education class before the first dose of her treatment. I will call her pharmacy with prescription for Compazine 10 mg p.o. every 6 hours as needed for nausea as well as Emla cream to be applied to the Port-A-Cath site before treatment. I will refer the patient to interventional radiology for Port-A-Cath placement before starting chemotherapy. The patient will come back for follow-up visit with the first day of her treatment for reevaluation. She was advised to call immediately if she has any other concerning symptoms in the interval. The patient voices understanding of current disease status and treatment options and is in agreement with the current care plan.  All questions were answered. The patient knows to call the clinic with  any problems, questions or concerns. We can certainly see the patient much sooner if necessary.  The total time spent in the appointment was 55 minutes.  Disclaimer: This note was dictated with voice recognition software. Similar sounding words can inadvertently be transcribed and may not be corrected upon review.

## 2021-07-28 ENCOUNTER — Telehealth: Payer: Self-pay | Admitting: Internal Medicine

## 2021-07-28 ENCOUNTER — Ambulatory Visit
Admission: RE | Admit: 2021-07-28 | Discharge: 2021-07-28 | Disposition: A | Discharge status: Home / Self Care | Payer: Medicare Other | Source: Ambulatory Visit | Attending: Radiation Oncology | Admitting: Radiation Oncology

## 2021-07-28 ENCOUNTER — Ambulatory Visit: Payer: Medicare Other

## 2021-07-28 DIAGNOSIS — C7931 Secondary malignant neoplasm of brain: Secondary | ICD-10-CM | POA: Diagnosis not present

## 2021-07-28 NOTE — Telephone Encounter (Signed)
Scheduled appt per 9/6 LOS - unable to reach patient . Left message for patient with appt date and time

## 2021-07-29 ENCOUNTER — Ambulatory Visit
Admission: RE | Admit: 2021-07-29 | Discharge: 2021-07-29 | Disposition: A | Discharge status: Home / Self Care | Payer: Medicare Other | Source: Ambulatory Visit | Attending: Radiation Oncology | Admitting: Radiation Oncology

## 2021-07-29 ENCOUNTER — Other Ambulatory Visit: Payer: Self-pay

## 2021-07-29 ENCOUNTER — Ambulatory Visit: Payer: Medicare Other

## 2021-07-29 DIAGNOSIS — C7931 Secondary malignant neoplasm of brain: Secondary | ICD-10-CM | POA: Diagnosis not present

## 2021-07-30 ENCOUNTER — Emergency Department (HOSPITAL_COMMUNITY)
Admission: EM | Admit: 2021-07-30 | Discharge: 2021-07-31 | Disposition: A | Discharge status: Home / Self Care | Payer: Medicare Other | Attending: Emergency Medicine | Admitting: Emergency Medicine

## 2021-07-30 ENCOUNTER — Other Ambulatory Visit: Payer: Self-pay

## 2021-07-30 ENCOUNTER — Emergency Department (HOSPITAL_COMMUNITY): Payer: Medicare Other

## 2021-07-30 ENCOUNTER — Ambulatory Visit: Payer: Medicare Other

## 2021-07-30 ENCOUNTER — Encounter (HOSPITAL_COMMUNITY): Payer: Self-pay | Admitting: Emergency Medicine

## 2021-07-30 DIAGNOSIS — Z794 Long term (current) use of insulin: Secondary | ICD-10-CM | POA: Insufficient documentation

## 2021-07-30 DIAGNOSIS — Z79899 Other long term (current) drug therapy: Secondary | ICD-10-CM | POA: Insufficient documentation

## 2021-07-30 DIAGNOSIS — Z8616 Personal history of COVID-19: Secondary | ICD-10-CM | POA: Insufficient documentation

## 2021-07-30 DIAGNOSIS — I10 Essential (primary) hypertension: Secondary | ICD-10-CM | POA: Diagnosis not present

## 2021-07-30 DIAGNOSIS — Z87891 Personal history of nicotine dependence: Secondary | ICD-10-CM | POA: Diagnosis not present

## 2021-07-30 DIAGNOSIS — Z7982 Long term (current) use of aspirin: Secondary | ICD-10-CM | POA: Insufficient documentation

## 2021-07-30 DIAGNOSIS — W19XXXA Unspecified fall, initial encounter: Secondary | ICD-10-CM | POA: Insufficient documentation

## 2021-07-30 DIAGNOSIS — Z85118 Personal history of other malignant neoplasm of bronchus and lung: Secondary | ICD-10-CM | POA: Insufficient documentation

## 2021-07-30 DIAGNOSIS — M25552 Pain in left hip: Secondary | ICD-10-CM | POA: Insufficient documentation

## 2021-07-30 DIAGNOSIS — Y9289 Other specified places as the place of occurrence of the external cause: Secondary | ICD-10-CM | POA: Insufficient documentation

## 2021-07-30 DIAGNOSIS — R410 Disorientation, unspecified: Secondary | ICD-10-CM | POA: Diagnosis not present

## 2021-07-30 LAB — COMPREHENSIVE METABOLIC PANEL
ALT: 25 U/L (ref 0–44)
AST: 14 U/L — ABNORMAL LOW (ref 15–41)
Albumin: 2.6 g/dL — ABNORMAL LOW (ref 3.5–5.0)
Alkaline Phosphatase: 53 U/L (ref 38–126)
Anion gap: 10 (ref 5–15)
BUN: 25 mg/dL — ABNORMAL HIGH (ref 8–23)
CO2: 27 mmol/L (ref 22–32)
Calcium: 8.7 mg/dL — ABNORMAL LOW (ref 8.9–10.3)
Chloride: 108 mmol/L (ref 98–111)
Creatinine, Ser: 0.45 mg/dL (ref 0.44–1.00)
GFR, Estimated: >60 mL/min (ref >60)
Glucose, Bld: 191 mg/dL — ABNORMAL HIGH (ref 70–99)
Potassium: 4.1 mmol/L (ref 3.5–5.1)
Sodium: 145 mmol/L (ref 135–145)
Total Bilirubin: 1.1 mg/dL (ref 0.3–1.2)
Total Protein: 5.4 g/dL — ABNORMAL LOW (ref 6.5–8.1)

## 2021-07-30 LAB — CBC WITH DIFFERENTIAL/PLATELET
Abs Immature Granulocytes: 0.56 10*3/uL — ABNORMAL HIGH (ref 0.00–0.07)
Basophils Absolute: 0 10*3/uL (ref 0.0–0.1)
Basophils Relative: 0 %
Eosinophils Absolute: 0 10*3/uL (ref 0.0–0.5)
Eosinophils Relative: 0 %
HCT: 35.5 % — ABNORMAL LOW (ref 36.0–46.0)
Hemoglobin: 11.5 g/dL — ABNORMAL LOW (ref 12.0–15.0)
Immature Granulocytes: 5 %
Lymphocytes Relative: 9 %
Lymphs Abs: 1 10*3/uL (ref 0.7–4.0)
MCH: 30.2 pg (ref 26.0–34.0)
MCHC: 32.4 g/dL (ref 30.0–36.0)
MCV: 93.2 fL (ref 80.0–100.0)
Monocytes Absolute: 0.3 10*3/uL (ref 0.1–1.0)
Monocytes Relative: 3 %
Neutro Abs: 9.7 10*3/uL — ABNORMAL HIGH (ref 1.7–7.7)
Neutrophils Relative %: 83 %
Platelets: 57 10*3/uL — ABNORMAL LOW (ref 150–400)
RBC: 3.81 MIL/uL — ABNORMAL LOW (ref 3.87–5.11)
RDW: 14.8 % (ref 11.5–15.5)
WBC: 11.7 10*3/uL — ABNORMAL HIGH (ref 4.0–10.5)
nRBC: 0.2 % (ref 0.0–0.2)

## 2021-07-30 LAB — URINALYSIS, ROUTINE W REFLEX MICROSCOPIC
Bilirubin Urine: NEGATIVE
Glucose, UA: NEGATIVE mg/dL
Ketones, ur: NEGATIVE mg/dL
Leukocytes,Ua: NEGATIVE
Nitrite: NEGATIVE
Protein, ur: 30 mg/dL — AB
Specific Gravity, Urine: 1.02 (ref 1.005–1.030)
pH: 6 (ref 5.0–8.0)

## 2021-07-30 LAB — URINALYSIS, MICROSCOPIC (REFLEX): WBC, UA: NONE SEEN WBC/hpf (ref 0–5)

## 2021-07-30 LAB — CK: Total CK: 10 U/L — ABNORMAL LOW (ref 38–234)

## 2021-07-30 NOTE — ED Triage Notes (Signed)
Patient was found on the floor at home by her daughter around 2pm today. Family is unsure of how long the patient had be laying on the floor. The patient didn't realize she was on the floor and in fact thought she was in her bed. She now complains of left leg and hip pain. EMS noted external rotation to the left leg. HX Brain CA, A&O x2 baseline    EMS vitals: 138/84 BP 80 HR 16 RR 94% SPO2 on room air 254 CBG

## 2021-07-30 NOTE — ED Provider Notes (Signed)
[facility] DEPT Provider Note   CSN: [id number] Arrival date & time: 07/30/21  1536     History Chief Complaint  Patient presents with   Leg Pain   Hip Pain    Lynn Wiley is a 65 y.o. female.  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  Patient A&O x2 at baseline.  Per EMS notes, patient was found on the floor by her daughter.  Unsure of downtime.  Patient states that she was asleep in bed and her daughter woke her up on the floor.  She is unsure if she fell or how she got to the floor.  She reports pain to her left hip.  Denies any headache, neck pain, back pain, chest pain, shortness of breath, abdominal pain, nausea, vomiting, diarrhea, dysuria.  I spoke to the patient's daughter Noe Gens.  She confirms her listed medical history.  States that she is confused at baseline but will have intermittent episodes of worsening confusion.  She was scheduled to have radiation this afternoon so she went to wake her up and found her on the floor next to her bed.  She states that she was actually lying on a pillow.  Patient unsure how she got on the floor and her daughter is also unsure if she fell.  Her daughter states that she uses a wheelchair in her home and cannot stand or ambulate on her own.  She believes she likely stood up and fell to the floor.  She states she does not typically complain of left hip pain.     Past Medical History:  Diagnosis Date   Cancer ([place])    COVID-19 06/22/2021   Family history of breast cancer    Family history of kidney cancer    Family history of neurofibromatosis    Hypertension    Neurofibromatosis ([place])    Stroke ([place])    Stroke ([place]) 06/23/2021    Patient Active Problem List   Diagnosis Date Noted   Small cell carcinoma of left lung ([place])  07/27/2021   Encounter for antineoplastic chemotherapy 07/27/2021   Encounter for antineoplastic immunotherapy 07/27/2021   Genetic testing 07/22/2021   Brain metastases ([place]) 07/20/2021   Hyperglycemia, drug-induced 07/13/2021   Lung cancer ([place]) 07/13/2021   Pressure injury of skin 07/13/2021   Low serum thyroid stimulating hormone (TSH) 07/13/2021   Small cell lung cancer ([place])    Right leg weakness 07/12/2021   HTN (hypertension) 06/23/2021   Stroke ([place]) 06/23/2021   Bone metastases ([place]) 06/19/2021   Family history of breast cancer 06/17/2021   Family history of neurofibromatosis 06/17/2021   Family history of kidney cancer 06/17/2021   Neurofibromatosis ([place]) 06/17/2021   Mass of upper lobe of left lung 06/15/2021    Past Surgical History:  Procedure Laterality Date   BRONCHIAL BIOPSY  07/06/2021   Procedure: BRONCHIAL BIOPSIES;  Surgeon: Garner Nash, DO;  Location: [facility];  Service: Pulmonary;;   BRONCHIAL BRUSHINGS  07/06/2021   Procedure: BRONCHIAL BRUSHINGS;  Surgeon: Garner Nash, DO;  Location: [facility];  Service: Pulmonary;;   BRONCHIAL NEEDLE ASPIRATION BIOPSY  07/06/2021   Procedure: BRONCHIAL NEEDLE ASPIRATION BIOPSIES;  Surgeon: Garner Nash, DO;  Location: [facility];  Service: Pulmonary;;   NO PAST SURGERIES     VIDEO BRONCHOSCOPY WITH ENDOBRONCHIAL NAVIGATION Left  07/06/2021   Procedure: VIDEO BRONCHOSCOPY WITH ENDOBRONCHIAL NAVIGATION;  Surgeon: Garner Nash, DO;  Location: [facility];  Service: Pulmonary;  Laterality: Left;  ION   VIDEO BRONCHOSCOPY WITH RADIAL ENDOBRONCHIAL ULTRASOUND  07/06/2021   Procedure: VIDEO BRONCHOSCOPY WITH RADIAL ENDOBRONCHIAL ULTRASOUND;  Surgeon: Garner Nash, DO;  Location: [facility];  Service: Pulmonary;;     OB History   No obstetric history on file.     Family History  Problem Relation Age of Onset   Neurofibromatosis Mother    Lung cancer Mother    Throat cancer Father        d. in  his 70s   Neurofibromatosis Sister    Breast cancer Sister 78   Kidney cancer Brother        dx 32-60   Gastric cancer Brother        dx 74-60   Heart attack Brother    Neurofibromatosis Maternal Aunt    Neurofibromatosis Maternal Uncle    Breast cancer Paternal Aunt    Breast cancer Paternal Aunt    Cancer Paternal Uncle        NOS   Neurofibromatosis Maternal Grandmother    Cancer Cousin        possible ovarian cancer; 'abdominal cancer'    Social History   Tobacco Use   Smoking status: Former    Packs/day: 0.50    Types: Cigarettes    Quit date: 2017    Years since quitting: 5.6   Smokeless tobacco: Never  Substance Use Topics   Alcohol use: Not Currently   Drug use: Never    Home Medications Prior to Admission medications   Medication Sig Start Date End Date Taking? Authorizing Provider  acetaminophen (TYLENOL) 325 MG tablet Take 2 tablets (650 mg total) by mouth every 6 (six) hours as needed for mild pain (or Fever >/= 101). 07/16/21   Eulogio Bear U, DO  amLODipine (NORVASC) 10 MG tablet Take 10 mg by mouth 2 (two) times daily. 05/31/21   [provider]  ASPIRIN LOW DOSE 81 MG EC tablet Take 81 mg by mouth daily. 06/01/21   [provider]  carvedilol (COREG) 12.5 MG tablet Take 12.5 mg by mouth 2 (two) times daily. 06/01/21   [provider]  Continuous Blood Gluc Receiver (FREESTYLE LIBRE 2 READER) DEVI 1 Units by Does not apply route daily. 07/16/21   Geradine Girt, DO  Continuous Blood Gluc Sensor (FREESTYLE LIBRE 2 SENSOR) MISC To use with monitor 07/16/21   Eulogio Bear U, DO  dexamethasone (DECADRON) 4 MG tablet Take 1 tablet (4 mg total) by mouth 3 (three) times daily. Take with food. 07/19/21   Eppie Gibson, MD  insulin glargine (LANTUS) 100 UNIT/ML Solostar Pen Inject 12 Units into the skin daily. 07/16/21   Geradine Girt, DO  insulin lispro (HUMALOG) 100 UNIT/ML KwikPen Inject 3 units into the skin 3 times daily as directed with  meals if eating 50%. PLUS  SSI: CBG 121-150: 1 unit CBG 151-200: 2 units CBG 201-250: 3 units CBG 251-300: 5 units CBG 301-350: 7 units CBG 351-400: 9 units 07/16/21   Vann, Jessica U, DO  Insulin Pen Needle 32G X 4 MM MISC Use with insulin pens as directed. 07/16/21   Geradine Girt, DO  lidocaine (XYLOCAINE) 2 % solution Patient: Mix 1part 2% viscous lidocaine, 1part H20. Swish & swallow 45mL of diluted mixture, 95min before meals and at bedtime, up to QID 07/27/21   Isidore Moos,  Judson Roch, MD  lidocaine-prilocaine (EMLA) cream Apply to the Port-A-Cath site 30-60 minutes before chemotherapy. 07/27/21   Curt Bears, MD  Nystatin (GERHARDT'S BUTT CREAM) CREA Apply 1 application topically 4 (four) times daily. 07/16/21   Geradine Girt, DO  Omega-3 Fatty Acids (FISH OIL) 1200 MG CAPS Take 1,200 mg by mouth daily.    [provider]  pantoprazole (PROTONIX) 40 MG tablet Take 1 tablet (40 mg total) by mouth daily. 07/17/21   Geradine Girt, DO  pravastatin (PRAVACHOL) 40 MG tablet Take 40 mg by mouth at bedtime. 06/01/21   [provider]  prochlorperazine (COMPAZINE) 10 MG tablet Take 1 tablet (10 mg total) by mouth every 6 (six) hours as needed for nausea or vomiting. 07/27/21   Curt Bears, MD  sennosides-docusate sodium (SENOKOT-S) 8.6-50 MG tablet Take 2 tablets by mouth daily.    [provider]  vitamin C (ASCORBIC ACID) 500 MG tablet Take 500 mg by mouth daily.    [provider]    Allergies    Patient has no known allergies.  Review of Systems   Review of Systems  Unable to perform ROS: Dementia   Physical Exam Updated Vital Signs BP 138/88   Pulse 80   Temp 98.5 F (36.9 C) (Oral)   Resp 16   SpO2 96%   Physical Exam Vitals and nursing note reviewed.  Constitutional:      General: She is not in acute distress.    Appearance: Normal appearance. She is not ill-appearing, toxic-appearing or diaphoretic.  HENT:     Head: Normocephalic and  atraumatic.     Right Ear: External ear normal.     Left Ear: External ear normal.     Nose: Nose normal.     Mouth/Throat:     Mouth: Mucous membranes are moist.     Pharynx: Oropharynx is clear. No oropharyngeal exudate or posterior oropharyngeal erythema.  Eyes:     Extraocular Movements: Extraocular movements intact.  Cardiovascular:     Rate and Rhythm: Normal rate and regular rhythm.     Pulses: Normal pulses.     Heart sounds: Normal heart sounds. No murmur heard.   No friction rub. No gallop.  Pulmonary:     Effort: Pulmonary effort is normal. No respiratory distress.     Breath sounds: Normal breath sounds. No stridor. No wheezing, rhonchi or rales.     Comments: No anterior chest wall tenderness. Chest:     Chest wall: No tenderness.  Abdominal:     General: Abdomen is flat.     Palpations: Abdomen is soft.     Tenderness: There is no abdominal tenderness.     Comments: Abdomen is flat, soft, and nontender.  Musculoskeletal:        General: Tenderness present. Normal range of motion.     Cervical back: Normal range of motion and neck supple. No tenderness.     Comments: Moderate tenderness appreciated along the left anterior and lateral hip.  Unable to assess range of motion due to pain.  No obvious deformities noted.  No midline C, T, or L-spine tenderness.  Skin:    General: Skin is warm and dry.  Neurological:     Mental Status: She is alert.     Comments: History of CVA with chronic left-sided weakness.  Oriented to self.  States the president is "[employer]" and the year is "1922".  Following commands when asked.  Psychiatric:  Mood and Affect: Mood normal.        Behavior: Behavior normal.   ED Results / Procedures / Treatments   Labs (all labs ordered are listed, but only abnormal results are displayed) Labs Reviewed  COMPREHENSIVE METABOLIC PANEL - Abnormal; Notable for the following components:      Result Value   Glucose, Bld 191 (*)    BUN  25 (*)    Calcium 8.7 (*)    Total Protein 5.4 (*)    Albumin 2.6 (*)    AST 14 (*)    All other components within normal limits  CBC WITH DIFFERENTIAL/PLATELET - Abnormal; Notable for the following components:   WBC 11.7 (*)    RBC 3.81 (*)    Hemoglobin 11.5 (*)    HCT 35.5 (*)    Platelets 57 (*)    Neutro Abs 9.7 (*)    Abs Immature Granulocytes 0.56 (*)    All other components within normal limits  URINALYSIS, ROUTINE W REFLEX MICROSCOPIC - Abnormal; Notable for the following components:   APPearance HAZY (*)    Hgb urine dipstick SMALL (*)    Protein, ur 30 (*)    All other components within normal limits  CK - Abnormal; Notable for the following components:   Total CK 10 (*)    All other components within normal limits  URINALYSIS, MICROSCOPIC (REFLEX) - Abnormal; Notable for the following components:   Bacteria, UA FEW (*)    All other components within normal limits   EKG None  Radiology DG Chest 1 View  Result Date: 07/30/2021 CLINICAL DATA:  Fall EXAM: CHEST  1 VIEW COMPARISON:  07/06/2021 Correlation: PET-CT 07/07/2021 FINDINGS: Enlargement of cardiac silhouette. Atherosclerotic calcification aorta. Biapical pleuroparenchymal opacities unchanged. RIGHT upper lobe nodular focus 13 mm diameter not seen previously. Additional LEFT mid lung macrolobulated nodular opacity 2.6 x 4.1 cm again identified. Additional soft tissue nodules are seen over the chest bilaterally, patient with multiple cutaneous nodules on prior CT exam, question neurofibromatosis. No segmental infiltrate, pleural effusion or pneumothorax. No acute osseous findings. IMPRESSION: Persistent macrolobulated nodular opacity LEFT upper lobe 4.1 x 2.6 cm. Multiple cutaneous nodules project over chest bilaterally question neurofibromatosis. No acute abnormalities. Electronically Signed   By: Lavonia Dana M.D.   On: 07/30/2021 17:54   CT HEAD WO CONTRAST (5MM)  Result Date: 07/30/2021 CLINICAL DATA:  Found on the  floor. EXAM: CT HEAD WITHOUT CONTRAST TECHNIQUE: Contiguous axial images were obtained from the base of the skull through the vertex without intravenous contrast. COMPARISON:  MRI brain and CT head dated July 12, 2021. FINDINGS: Brain: No evidence of acute infarction, hemorrhage, hydrocephalus, extra-axial collection or mass lesion/mass effect. Stable atrophy and chronic microvascular ischemic changes. Scattered old lacunar infarcts again noted involving the periventricular white matter, right basal ganglia, pons, and bilateral cerebellum. Vascular: Atherosclerotic vascular calcification of the carotid siphons. No hyperdense vessel. Skull: Grossly unchanged metastases in the right parietal skull. Negative for fracture. Sinuses/Orbits: No acute findings. Unchanged partial opacification of the right mastoid air cells. Other: None. IMPRESSION: 1. No acute intracranial abnormality. 2. Stable atrophy, chronic microvascular ischemic changes, and scattered old lacunar infarcts. 3. Grossly unchanged skull metastases. Electronically Signed   By: Titus Dubin M.D.   On: 07/30/2021 17:11   CT Cervical Spine Wo Contrast  Result Date: 07/30/2021 CLINICAL DATA:  Neck pain with history of metastatic disease. EXAM: CT CERVICAL SPINE WITHOUT CONTRAST TECHNIQUE: Multidetector CT imaging of the cervical spine was performed  without intravenous contrast. Multiplanar CT image reconstructions were also generated. COMPARISON:  MRI cervical spine 07/12/2021.    PET CT 07/07/2021. FINDINGS: Alignment: Normal. There is reversal of normal cervical lordosis which can be seen with muscle spasm. Skull base and vertebrae: There are numerous lucent lesions throughout the spine compatible with metastatic disease as seen on prior MRI. This is most extensive in the C6 vertebral body where there is loss of cortex and destruction of the right-sided pedicle and facet. There is also extensive involvement of the right C7-T1 pedicle and facet as  well as right T1 spinous process and adjacent rib. No acute fractures are identified. Soft tissues and spinal canal: There is no definite central canal narrowing allowing for lack of intravenous contrast. Soft tissues of metastatic disease are seen occupying the neural foramina on the right at C5-C6 and C7-T1. This appears similar to prior MRI given differences in technique. There is no prevertebral soft tissue swelling. Disc levels: There is diffuse disc space narrowing and osteophyte formation compatible with degenerative change. Upper chest: There is scarring in both lung apices. On the scout view, nodular density in the left upper lobe appears grossly unchanged, but is not well evaluated. Other: None. IMPRESSION: 1. No acute fracture or dislocation identified. 2. Extensive osseous metastatic disease, particularly at the C6 level and T1 levels appear grossly unchanged from prior MRI. Electronically Signed   By: Ronney Asters M.D.   On: 07/30/2021 17:11   DG Knee Complete 4 Views Left  Result Date: 07/30/2021 CLINICAL DATA:  Fall EXAM: LEFT KNEE - COMPLETE 4+ VIEW COMPARISON:  None. FINDINGS: No evidence of fracture, dislocation, or joint effusion. Linear opacity in the distal femur appears benign and may be the sequela of prior trauma. No evidence of arthropathy or other focal bone abnormality. Soft tissues are unremarkable. IMPRESSION: Negative. Electronically Signed   By: Merilyn Baba M.D.   On: 07/30/2021 19:19   DG HIPS BILAT WITH PELVIS 3-4 VIEWS  Result Date: 07/30/2021 CLINICAL DATA:  BILATERAL hip pain post fall EXAM: DG HIP (WITH OR WITHOUT PELVIS) 3-4V BILAT COMPARISON:  None FINDINGS: Osseous demineralization. Hip and SI joint spaces preserved. No acute fracture, dislocation, or bone destruction. Tubal ligation clips in pelvis. IMPRESSION: No acute osseous abnormalities. Electronically Signed   By: Lavonia Dana M.D.   On: 07/30/2021 17:49    Procedures Procedures   Medications Ordered in  ED Medications - No data to display  ED Course  I have reviewed the triage vital signs and the nursing notes.  Pertinent labs & imaging results that were available during my care of the patient were reviewed by me and considered in my medical decision making (see chart for details).  Clinical Course as of 07/30/21 2321  Fri Jul 30, 2021  1911 CBC with Differential(!) Similar to patient's prior baseline values. [LJ]    Clinical Course User Index [LJ] Rayna Sexton, PA-C   MDM Rules/Calculators/A&P                          Pt is a 65 y.o. female who presents to the emergency department due to what appears to be an unwitnessed fall that occurred earlier today.  Labs: CBC with white count of 11.7, hemoglobin of 11.5, hematocrit of 35.5, platelets of 57, neutrophils of 9.7, absolute immature granulocytes of 0.56. CMP with glucose 191, BUN of 25, calcium of 8.7, total protein of 5.4, albumin of 2.6, AST of 14.  CK less than 10. UA with small hemoglobin, 30 protein, few bacteria.  Imaging: Chest x-ray shows a persistent macrolobulated nodular opacity in the left upper lobe measuring 4.1 x 2.6 cm.  Multiple cutaneous nodules project of the chest bilaterally questioning neurofibromatosis.  No acute abnormalities. CT scan of the cervical spine without contrast shows no acute fracture or dislocation identified.  Extensive osseous metastatic disease particularly at the C6 level and T1 levels appear grossly unchanged from prior MRI. CT scan of the head without contrast shows no acute intracranial abnormalities.  Stable atrophy, chronic microvascular ischemic changes, and scattered old lacunar infarcts.  Grossly unchanged skull metastasis. X-ray of the left knee is negative. X-ray of the pelvis and hips shows no acute osseous abnormalities.  I, Rayna Sexton, PA-C, personally reviewed and evaluated these images and lab results as part of my medical decision-making.  Physical exam  significant for tenderness along the left hip.  No obvious deformities or overlying skin changes noted on my exam.  Given patient's baseline mental status and unwitnessed fall we obtained CT scans of the head and neck as well as additional x-rays of the chest, pelvis, hips, and left knee.  The imaging shows no acute osseous abnormalities.  Lab work today appears to be consistent with patient's baseline.  Given an unknown downtime a CK was obtained which is less than 10.  Doubt rhabdomyolysis.  UA does not appear infectious.  Feel that patient is stable for discharge at this time and her daughter at bedside is agreeable.  We will arrange for transport back to her home where her other daughter and additional caregiver also resides.  Discussed return precautions with her daughter.  Her questions were answered and she was amicable at the time of discharge.  Note: Portions of this report may have been transcribed using voice recognition software. Every effort was made to ensure accuracy; however, inadvertent computerized transcription errors may be present.   Final Clinical Impression(s) / ED Diagnoses Final diagnoses:  Fall, initial encounter  Left hip pain    Rx / DC Orders ED Discharge Orders     None        Rayna Sexton, PA-C 07/30/21 2326    Charlesetta Shanks, MD 07/30/21 2355

## 2021-07-30 NOTE — Discharge Instructions (Signed)
Please continue to monitor Lynn Wiley's symptoms closely.  If she develops any new or worsening symptoms of the weekend please bring her back to the emergency department.  It was a pleasure to meet you both.

## 2021-07-31 NOTE — ED Notes (Signed)
Pt noted to have done a BM. This RN offered to clean pt up. Pt declined to be clean, stated "It can be done at home."

## 2021-07-31 NOTE — ED Notes (Signed)
[facility] contacted to setup transport home for pt.

## 2021-08-02 ENCOUNTER — Telehealth: Payer: Self-pay

## 2021-08-02 ENCOUNTER — Encounter: Payer: Self-pay | Admitting: Radiation Oncology

## 2021-08-02 ENCOUNTER — Ambulatory Visit: Payer: Medicare Other

## 2021-08-02 NOTE — Progress Notes (Signed)
Pharmacist Chemotherapy Monitoring - Initial Assessment    Anticipated start date: 08/12/2021   The following has been reviewed per standard work regarding the patient's treatment regimen: The patient's diagnosis, treatment plan and drug doses, and organ/hematologic function Lab orders and baseline tests specific to treatment regimen  The treatment plan start date, drug sequencing, and pre-medications Prior authorization status  Patient's documented medication list, including drug-drug interaction screen and prescriptions for anti-emetics and supportive care specific to the treatment regimen The drug concentrations, fluid compatibility, administration routes, and timing of the medications to be used The patient's access for treatment and lifetime cumulative dose history, if applicable  The patient's medication allergies and previous infusion related reactions, if applicable   Changes made to treatment plan:  N/A  Follow up needed:  Pending authorization for treatment     Kennith Center, Pharm.D., CPP 08/02/2021@2 :49 PM

## 2021-08-02 NOTE — Telephone Encounter (Signed)
Notified by radiation therapist that patient did not present for radiation appointment today. Called both numbers for patient's daughters, but was not able to leave a VM on either number. Will call again tomorrow to check on patient's status and whether or not she plans to proceed with radiation treatment.

## 2021-08-03 ENCOUNTER — Ambulatory Visit: Payer: Medicare Other

## 2021-08-04 ENCOUNTER — Ambulatory Visit: Payer: Medicare Other

## 2021-08-05 ENCOUNTER — Inpatient Hospital Stay: Payer: Medicare Other

## 2021-08-05 ENCOUNTER — Telehealth: Payer: Self-pay

## 2021-08-05 ENCOUNTER — Ambulatory Visit: Payer: Medicare Other

## 2021-08-05 NOTE — Telephone Encounter (Signed)
LM for Ms Bencosme to call Dr. Worthy Flank office to rescheduled chemotherapy class or to discuss other treatment options as she has not kept recent radiation appointments.

## 2021-08-06 ENCOUNTER — Ambulatory Visit: Payer: Medicare Other

## 2021-08-06 ENCOUNTER — Telehealth: Payer: Self-pay | Admitting: Medical Oncology

## 2021-08-06 MED FILL — Fosaprepitant Dimeglumine For IV Infusion 150 MG (Base Eq): INTRAVENOUS | Qty: 5 | Status: AC

## 2021-08-06 MED FILL — Dexamethasone Sodium Phosphate Inj 100 MG/10ML: INTRAMUSCULAR | Qty: 1 | Status: AC

## 2021-08-06 NOTE — Telephone Encounter (Signed)
Daughter contacted. Stated pt does not want to proceed with treatment. Pt now on Hospice. Appts cancelled per daughter.

## 2021-08-09 ENCOUNTER — Ambulatory Visit: Payer: Medicare Other

## 2021-08-09 ENCOUNTER — Other Ambulatory Visit: Payer: Medicare Other

## 2021-08-09 ENCOUNTER — Ambulatory Visit: Payer: Medicare Other | Admitting: Physician Assistant

## 2021-08-09 ENCOUNTER — Other Ambulatory Visit: Payer: Self-pay | Admitting: Radiation Oncology

## 2021-08-10 ENCOUNTER — Ambulatory Visit: Payer: Medicare Other

## 2021-08-11 ENCOUNTER — Ambulatory Visit: Payer: Medicare Other

## 2021-08-11 ENCOUNTER — Telehealth: Payer: Self-pay | Admitting: Medical Oncology

## 2021-08-11 ENCOUNTER — Other Ambulatory Visit: Payer: Self-pay | Admitting: Medical Oncology

## 2021-08-12 ENCOUNTER — Ambulatory Visit: Payer: Medicare Other

## 2021-08-16 ENCOUNTER — Other Ambulatory Visit: Payer: Medicare Other

## 2021-08-21 NOTE — Telephone Encounter (Signed)
LVM to see if pt is on Hospice with [facility]

## 2021-08-21 DEATH — deceased

## 2021-08-23 ENCOUNTER — Other Ambulatory Visit: Payer: Medicare Other

## 2021-08-30 ENCOUNTER — Ambulatory Visit: Payer: Medicare Other | Admitting: Internal Medicine

## 2021-08-30 ENCOUNTER — Other Ambulatory Visit: Payer: Medicare Other

## 2021-08-30 ENCOUNTER — Ambulatory Visit: Payer: Medicare Other

## 2021-08-31 ENCOUNTER — Ambulatory Visit: Payer: Medicare Other

## 2021-09-01 ENCOUNTER — Ambulatory Visit: Payer: Medicare Other

## 2021-09-06 ENCOUNTER — Other Ambulatory Visit: Payer: Medicare Other

## 2021-09-07 ENCOUNTER — Ambulatory Visit: Payer: Self-pay | Admitting: Radiation Oncology

## 2021-09-13 ENCOUNTER — Other Ambulatory Visit: Payer: Medicare Other

## 2021-09-20 ENCOUNTER — Ambulatory Visit: Payer: Medicare Other

## 2021-09-20 ENCOUNTER — Ambulatory Visit: Payer: Medicare Other | Admitting: Internal Medicine

## 2021-09-20 ENCOUNTER — Other Ambulatory Visit: Payer: Medicare Other

## 2021-09-21 ENCOUNTER — Ambulatory Visit: Payer: Medicare Other

## 2021-09-22 ENCOUNTER — Ambulatory Visit: Payer: Medicare Other

## 2021-12-15 NOTE — Progress Notes (Signed)
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  Whole Brain received 20 Gy in 8 fractions and treatment was then prematurely stopped  Narrative: The patient tolerated radiation treatment relatively well and then had a fall, prompting ED visit on 07-30-21. Patient family did not show for radiation after that.  Patient/family contacted by nursing. They opted for hospice enrollment and to stop RT prematurely  Plan:  The patient will return to radiation oncology clinic PRN. -----------------------------------  Eppie Gibson, MD

## 2022-08-12 IMAGING — MR MR CERVICAL SPINE WO/W CM
6 of 8 series · 32 of 48 positions shown · IV contrast (gadavist)
Comparison: 06/07/2021

CLINICAL DATA: Metastatic disease evaluation

EXAM:
MRI CERVICAL SPINE WITHOUT AND WITH CONTRAST
TECHNIQUE: Multiplanar and multiecho pulse sequences of the cervical spine, to
include the craniocervical junction and cervicothoracic junction,
were obtained without and with intravenous contrast.
CONTRAST:  5mL GADAVIST GADOBUTROL 1 MMOL/ML IV SOLN

[Series 14: T1 · sagittal · 3.0mm · 0.69mm/px · 4 of 15 slices shown (1 of 2)]
[im 1/15]
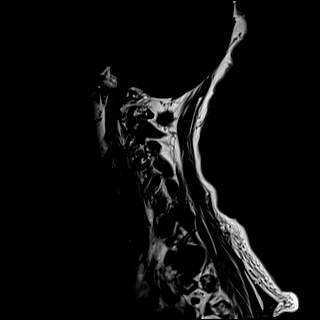
[im 5/15]
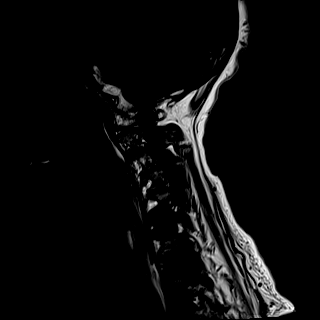
[im 10/15]
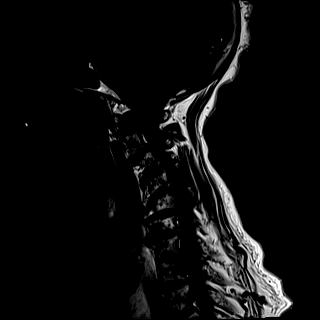
[im 15/15]
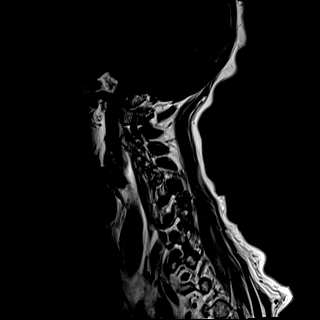

[Series 15: STIR · sagittal · 3.0mm · 0.86mm/px · 4 of 15 slices shown]
[im 1/15]
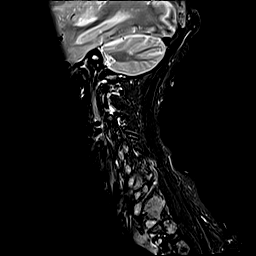
[im 5/15]
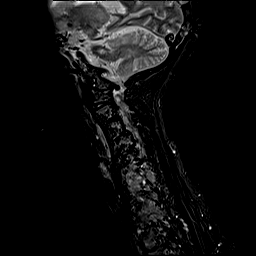
[im 10/15]
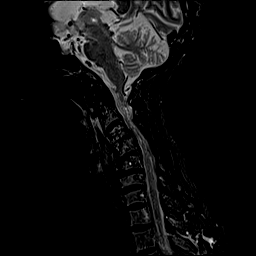
[im 15/15]
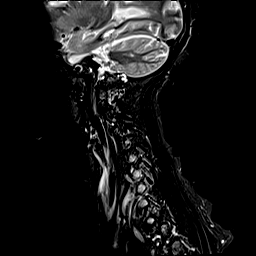

[Series 16: T2 · axial · 3.0mm · 0.70mm/px · z∈[-210,-109]mm · 8 of 32 slices shown]
[im 1/32]
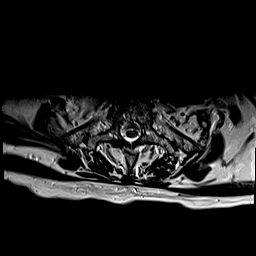
[im 5/32]
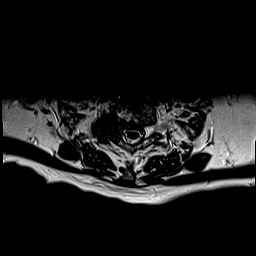
[im 9/32]
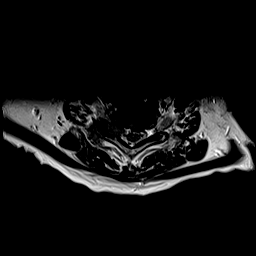
[im 14/32]
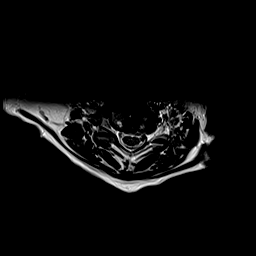
[im 18/32]
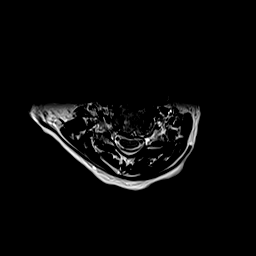
[im 23/32]
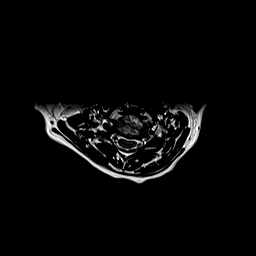
[im 27/32]
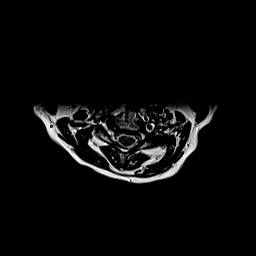
[im 32/32]
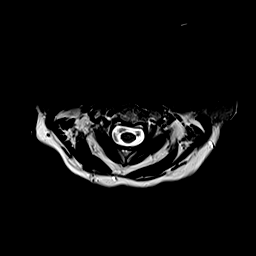

[Series 18: T1 · axial · 3.0mm · 0.35mm/px · z∈[-210,-109]mm · 8 of 32 slices shown (2 of 2)]
[im 1/32]
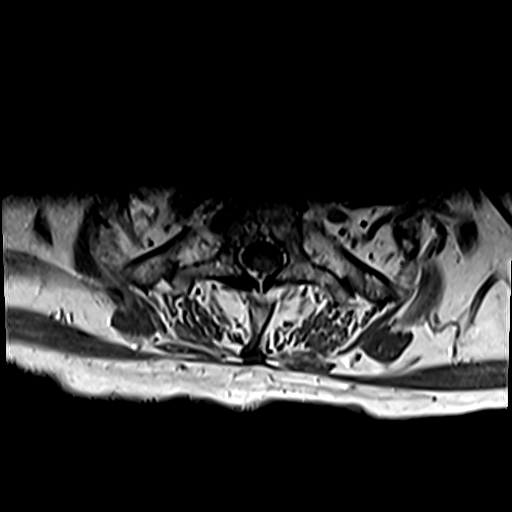
[im 5/32]
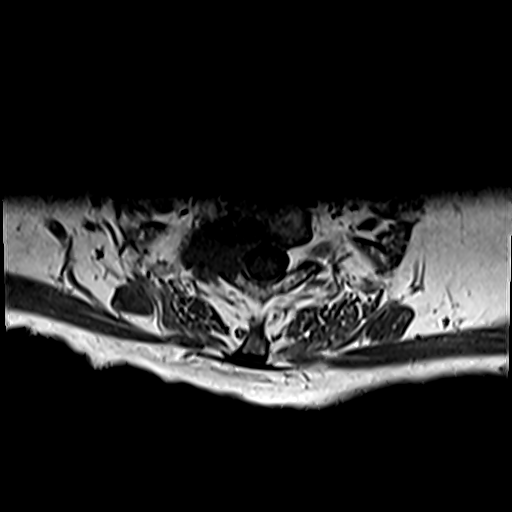
[im 9/32]
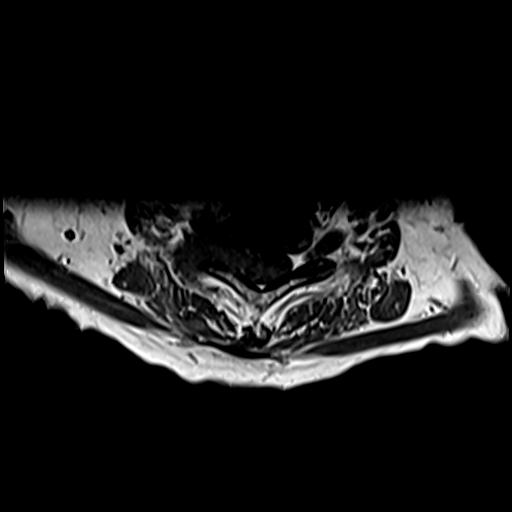
[im 14/32]
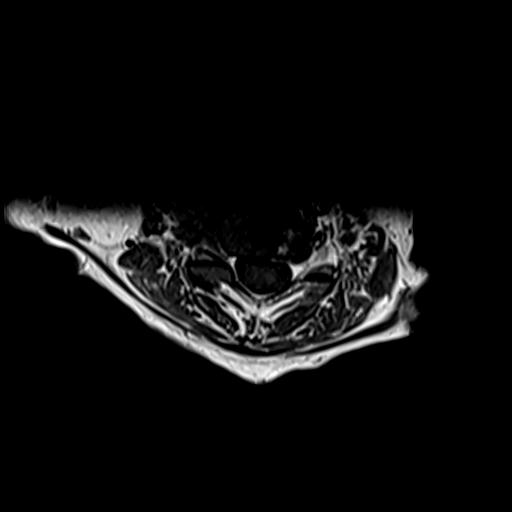
[im 18/32]
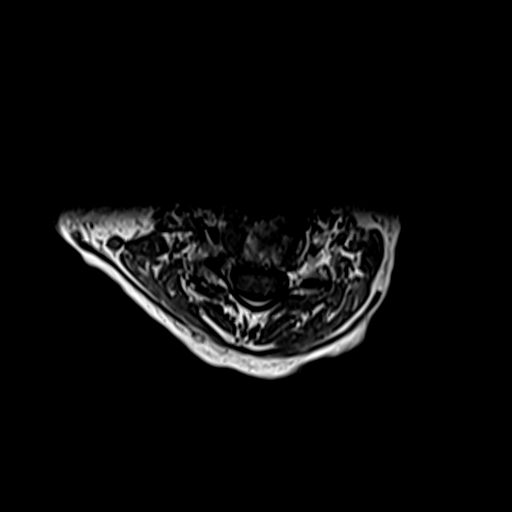
[im 23/32]
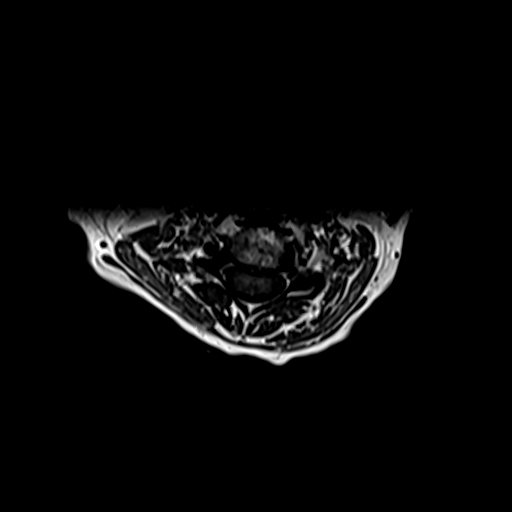
[im 27/32]
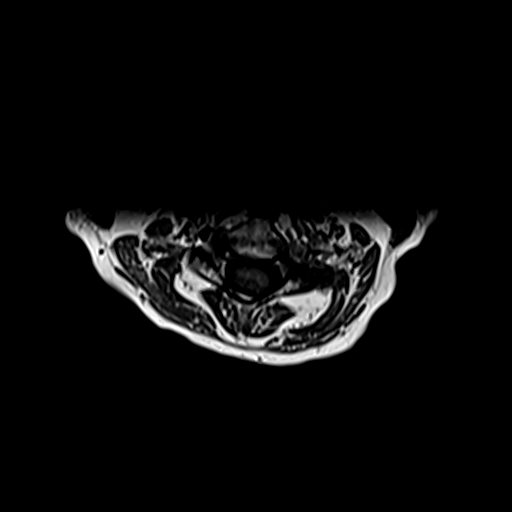
[im 32/32]
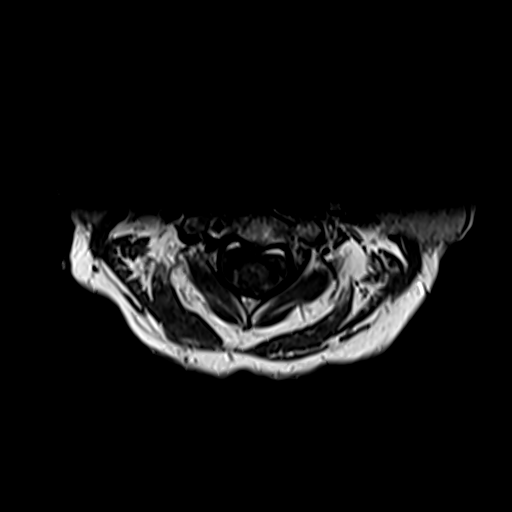

[Series 19: T2 post-contrast · sagittal · 3.0mm · 0.69mm/px · 4 of 15 slices shown]
[im 1/15]
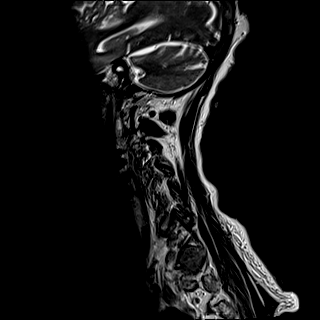
[im 5/15]
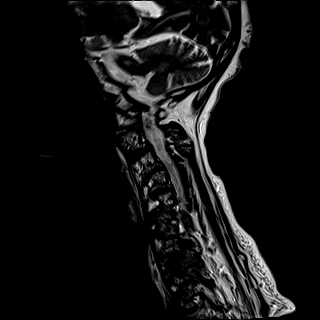
[im 10/15]
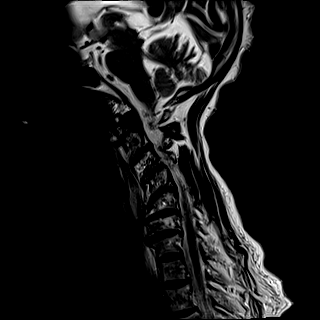
[im 15/15]
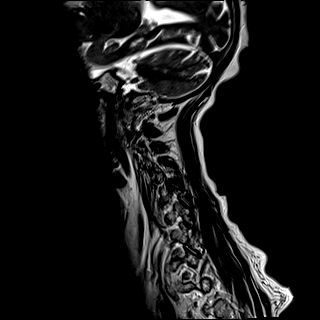

[Series 20: T1 fat-sat post-contrast · sagittal · 3.0mm · 0.69mm/px · 4 of 15 slices shown]
[im 1/15]
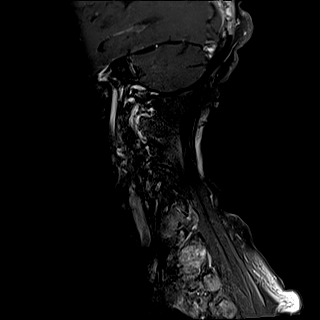
[im 5/15]
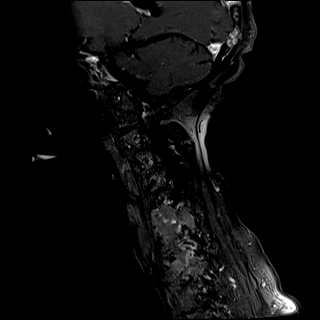
[im 10/15]
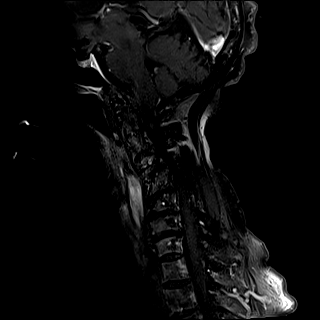
[im 15/15]
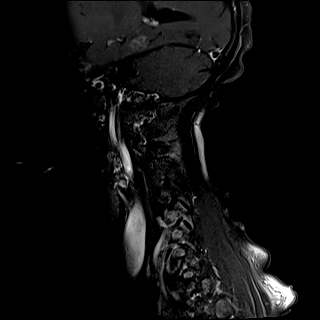

[32 of 48 positions shown; findings below may reference images not displayed]

FINDINGS: Alignment: Reversal of the normal cervical lordosis. Trace
anterolisthesis of C3 on C4.

Vertebrae: Redemonstrated marrow replacing lesions in the C6 and T1
vertebral bodies, as well as the C5 and C7 vertebral bodies to a
lesser extent, extending to the right pedicles and posterior
elements. Again noted are bulky extraosseous soft tissue masses,
which extend into the right foramina at C5-C6, C6-C7, C7-T1, and
T1-T2. One total, the mass measures up to 5.9 x 3.2 x 3.6 cm (series
20, image 5 and series 16, image 27), previously 5.6 x 2.2 x 3.5 cm.
The posterior aspect of the right first and second ribs are also
likely involved. No pathologic fracture is seen.

Cord: Normal signal and morphology. Extraosseous extension of tumor
posteriorly at C6, and on the right at C7 and T1, overall unchanged.
No evidence of cord compression.

Posterior Fossa, vertebral arteries, paraspinal tissues: Vertebral
artery flow voids are maintained, although again noted is encasement
of the right vertebral artery at the level of C6, as well as now at
C5. Extension of the previously noted soft tissue masses in the
right paraspinal soft tissues, as described above. Redemonstrated
infarct in the pons.

Disc levels:

C2-C3: Disc osteophyte complex. Left greater than right facet
arthropathy. No spinal canal stenosis. Moderate to severe left
neural foraminal narrowing.

C3-C4: Trace anterolisthesis with disc unroofing. Mild left greater
than right facet arthropathy. No spinal canal stenosis. No neural
foraminal narrowing.

C4-C5: Minimal disc bulge. Uncovertebral and facet arthropathy. No
spinal canal stenosis or neural foraminal narrowing.

C5-C6: Disc height loss with minimal disc bulge. Uncovertebral and
facet arthropathy. No spinal canal stenosis. Tumor is again seen in
the right neural foramen. No epidural extension of tumor at this
level.

C6-C7: Disc osteophyte. Facet and uncovertebral hypertrophy. Tumor
is again noted in the right neural foramen. No spinal canal
stenosis. No epidural extension of tumor.

C7-T1: Complete occlusion of the right neural foramen secondary to
tumor. There is epidural extension, with mass effect on the thecal
sac but no spinal canal stenosis. The left neural foramen is
unremarkable.

The right T1-T2 neural foramen also appears completely occluded
secondary to tumor.
IMPRESSION: 1. Redemonstrated marrow replacing lesions in the C6 and T1
vertebral bodies, as well as the C5 and C7 vertebral bodies to a
lesser extent, primarily involving the right aspect of the vertebral
bodies, pedicles, and posterior elements. This has slightly
increased in size compared to the prior exam. Epidural extension of
tumor at C7-T1 and T1-T2, with mild mass effect on the thecal sac
but no significant canal stenosis or cord signal abnormality.
Associated extraosseous soft tissue in the right-sided foramina
again noted at C5-C6, C6-C7, C7-T1, and T1-T2.
2. Redemonstrated encasement of the right vertebral artery at the C6
level, now with some encasement at C5. The flow void is maintained.
